# Patient Record
Sex: Male | Born: 1985
Health system: Southern US, Community
[De-identification: ages and names within clinical notes are randomized; demographics above are authoritative.]

---
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
## 2015-11-10 ENCOUNTER — Ambulatory Visit (INDEPENDENT_AMBULATORY_CARE_PROVIDER_SITE_OTHER): Payer: No Typology Code available for payment source | Admitting: Physician Assistant

## 2015-11-10 ENCOUNTER — Encounter: Payer: Self-pay | Admitting: Physician Assistant

## 2015-11-10 VITALS — BP 110/80 | HR 87 | Temp 99.0°F | Resp 16 | Wt 147.4 lb

## 2015-11-10 DIAGNOSIS — R6889 Other general symptoms and signs: Secondary | ICD-10-CM | POA: Diagnosis not present

## 2015-11-10 DIAGNOSIS — J101 Influenza due to other identified influenza virus with other respiratory manifestations: Secondary | ICD-10-CM | POA: Diagnosis not present

## 2015-11-10 LAB — POCT INFLUENZA A/B
Influenza A, POC: POSITIVE — AB
Influenza B, POC: NEGATIVE

## 2015-11-10 MED ORDER — OSELTAMIVIR PHOSPHATE 75 MG PO CAPS: 75.0000 mg | ORAL_CAPSULE | Freq: Two times a day (BID) | ORAL | Status: DC

## 2015-11-10 NOTE — Patient Instructions (Signed)

## 2015-11-10 NOTE — Progress Notes (Signed)
Patient: Steven Sanchez Male    DOB: June 15, 1986   30 y.o.   MRN: [id number] Visit Date: 11/10/2015  Today's Provider: Margaretann Loveless, PA-C   Chief Complaint  Patient presents with  . Flu like symptoms   Subjective:    HPI Steven Sanchez is here with c/o sore throat on Monday. Yesterday with body aches, fever with temperature of 100 yesterday, cough, runny nose,sneezing and chest tightness. No chest pain, leg swelling,palpitations, wheezing and SOB. Treatments: Mucinex and Nyquil.  His mother was here earlier today and tested flu positive.      No Known Allergies Previous Medications   No medications on file    Review of Systems  Constitutional: Positive for fever, chills and fatigue.  HENT: Positive for congestion, ear pain, postnasal drip, rhinorrhea, sinus pressure, sneezing and sore throat (Just scratchy). Negative for hearing loss and trouble swallowing.   Respiratory: Positive for cough and chest tightness. Negative for shortness of breath and wheezing.   Cardiovascular: Negative for chest pain, palpitations and leg swelling.  Gastrointestinal: Negative for nausea, vomiting, abdominal pain and diarrhea.  Musculoskeletal: Positive for myalgias.  Neurological: Positive for headaches. Negative for dizziness.    Social History  Substance Use Topics  . Smoking status: Never Smoker   . Smokeless tobacco: Not on file  . Alcohol Use: Yes     Comment: occasional   Objective:   BP 110/80 mmHg  Pulse 87  Temp(Src) 99 F (37.2 C) (Oral)  Resp 16  Wt 147 lb 6.4 oz (66.86 kg)  SpO2 99%  Physical Exam  Constitutional: He appears well-developed and well-nourished. No distress.  HENT:  Head: Normocephalic and atraumatic.  Right Ear: Hearing, external ear and ear canal normal. Tympanic membrane is not erythematous and not bulging. A middle ear effusion is present.  Left Ear: Hearing, tympanic membrane, external ear and ear canal normal. Tympanic membrane is not  erythematous and not bulging.  No middle ear effusion.  Nose: Mucosal edema and rhinorrhea present. Right sinus exhibits no maxillary sinus tenderness and no frontal sinus tenderness. Left sinus exhibits no maxillary sinus tenderness and no frontal sinus tenderness.  Mouth/Throat: Uvula is midline and mucous membranes are normal. Posterior oropharyngeal erythema present. No oropharyngeal exudate or posterior oropharyngeal edema.  Eyes: Conjunctivae and EOM are normal. Pupils are equal, round, and reactive to light. Right eye exhibits no discharge. Left eye exhibits no discharge.  Neck: Normal range of motion. Neck supple. No tracheal deviation present. No Brudzinski's sign and no Kernig's sign noted. No thyromegaly present.  Cardiovascular: Normal rate, regular rhythm and normal heart sounds.  Exam reveals no gallop and no friction rub.   No murmur heard. Pulmonary/Chest: Effort normal and breath sounds normal. No stridor. No respiratory distress. He has no wheezes. He has no rales.  Lymphadenopathy:    He has no cervical adenopathy.  Skin: Skin is warm and dry. He is not diaphoretic.  Vitals reviewed.       Assessment & Plan:     1. Influenza A Will treat with tamiflu as below since he is still within the 48 hr window of symptom onset. He may use tylenol and IBU alternating for fever and body aches. He needs to stay well hydrated.  He needs to get plenty of rest. He is to call the office if symptoms fail to improve or worsen. - oseltamivir (TAMIFLU) 75 MG capsule; Take 1 capsule (75 mg total) by mouth 2 (two) times  daily.  Dispense: 10 capsule; Refill: 0  2. Flu-like symptoms Positive for Influenza A. - POCT Influenza A/B       Margaretann LovelessJennifer M Burnette, PA-C  [facility]

## 2017-01-03 ENCOUNTER — Encounter: Payer: Self-pay | Admitting: Family Medicine

## 2017-01-03 ENCOUNTER — Ambulatory Visit (INDEPENDENT_AMBULATORY_CARE_PROVIDER_SITE_OTHER): Payer: 59 | Admitting: Family Medicine

## 2017-01-03 VITALS — BP 112/64 | HR 74 | Temp 97.4°F | Resp 16 | Wt 147.0 lb

## 2017-01-03 DIAGNOSIS — J069 Acute upper respiratory infection, unspecified: Secondary | ICD-10-CM | POA: Diagnosis not present

## 2017-01-03 NOTE — Patient Instructions (Signed)
Discussed use of Mucinex D for congestion and Delsym for cough. Call me if not improving over the next few days.

## 2017-01-03 NOTE — Progress Notes (Signed)
Subjective:     Patient ID: Raechel AcheStefan M Lafrance, male   DOB: 1986/06/28, 31 y.o.   MRN: [id number]  HPI  Chief Complaint  Patient presents with  . Sinus Problem    Patient comes into office today with complaints of sinus pain and pressure for the past 7 days. Patient reports cough productive of yellow phlegm, ears popping, muscle aches, sinus headaches and nauseas and vomiting. 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 Reports sinus drainage is clear.   Review of Systems     Objective:   Physical Exam  Constitutional: He appears well-developed and well-nourished. No distress.  Ears: T.M's intact without inflammation Sinuses: mild paranasal sinus tenderness Throat: no tonsillar enlargement or exudate Neck: no cervical adenopathy Lungs: clear     Assessment:    1. Viral upper respiratory tract infection     Plan:    Discussed use of Delsym and Mucinex D.

## 2017-05-07 DIAGNOSIS — Z23 Encounter for immunization: Secondary | ICD-10-CM | POA: Diagnosis not present

## 2018-03-13 DIAGNOSIS — Z23 Encounter for immunization: Secondary | ICD-10-CM | POA: Diagnosis not present

## 2018-07-22 ENCOUNTER — Ambulatory Visit: Payer: 59 | Admitting: Physician Assistant

## 2018-07-22 ENCOUNTER — Encounter: Payer: Self-pay | Admitting: Physician Assistant

## 2018-07-22 VITALS — BP 140/100 | HR 87 | Temp 98.2°F | Resp 16 | Wt 148.0 lb

## 2018-07-22 DIAGNOSIS — Z202 Contact with and (suspected) exposure to infections with a predominantly sexual mode of transmission: Secondary | ICD-10-CM

## 2018-07-22 MED ORDER — AZITHROMYCIN 500 MG PO TABS: 1000.0000 mg | ORAL_TABLET | Freq: Once | ORAL | 0 refills | Status: AC

## 2018-07-22 NOTE — Progress Notes (Signed)
Acute Office Visit  Subjective:    Patient ID: Steven Sanchez, male    DOB: [birth year], 32 y.o.   MRN: [id number]  Chief Complaint  Patient presents with  . SEXUALLY TRANSMITTED DISEASE   HPI Patient is in today for possible sexually transmitted disease, patient reports that in August he had unprotected intercourse and has recently found out that partner was positive for STI. Patient reports that in August he did have some discharge that cleared on its own. Patient denies any symptoms and reports he has not been sexually active since then. Partner tested positive for chlamydia. No other sexual partners.  No past medical history on file.  No past surgical history on file.  Family History  Problem Relation Age of Onset  . Hypertension Father     Social History   Socioeconomic History  . Marital status: Single    Spouse name: Not on file  . Number of children: Not on file  . Years of education: Not on file  . Highest education level: Not on file  Occupational History  . Not on file  Social Needs  . Financial resource strain: Not on file  . Food insecurity:    Worry: Not on file    Inability: Not on file  . Transportation needs:    Medical: Not on file    Non-medical: Not on file  Tobacco Use  . Smoking status: Never Smoker  . Smokeless tobacco: Never Used  Substance and Sexual Activity  . Alcohol use: Yes    Comment: occasional  . Drug use: No  . Sexual activity: Not on file  Lifestyle  . Physical activity:    Days per week: Not on file    Minutes per session: Not on file  . Stress: Not on file  Relationships  . Social connections:    Talks on phone: Not on file    Gets together: Not on file    Attends religious service: Not on file    Active member of club or organization: Not on file    Attends meetings of clubs or organizations: Not on file    Relationship status: Not on file  . Intimate partner violence:    Fear of current or ex partner: Not on file      Emotionally abused: Not on file    Physically abused: Not on file    Forced sexual activity: Not on file  Other Topics Concern  . Not on file  Social History Narrative  . Not on file    No outpatient medications prior to visit.   No facility-administered medications prior to visit.     No Known Allergies  Review of Systems  Constitutional: Negative.   Respiratory: Negative.   Cardiovascular: Negative.   Gastrointestinal: Negative.   Genitourinary: Negative.        Objective:    Physical Exam  Constitutional: He appears well-developed and well-nourished. No distress.  HENT:  Head: Normocephalic and atraumatic.  Neck: Normal range of motion. Neck supple.  Cardiovascular: Normal rate, regular rhythm and normal heart sounds. Exam reveals no gallop and no friction rub.  No murmur heard. Pulmonary/Chest: Effort normal and breath sounds normal. No respiratory distress. He has no wheezes. He has no rales.  Genitourinary:  Genitourinary Comments: Patient declines  Skin: He is not diaphoretic.  Vitals reviewed.   BP (!) 140/100 (BP Location: Left Arm, Patient Position: Sitting, Cuff Size: Normal) Comment: patient finished working out about 30 mins ago  Pulse 87   Temp 98.2 F (36.8 C) (Oral)   Resp 16   Wt 148 lb (67.1 kg)   SpO2 97%  Wt Readings from Last 3 Encounters:  07/22/18 148 lb (67.1 kg)  01/03/17 147 lb (66.7 kg)  11/10/15 147 lb 6.4 oz (66.9 kg)    Health Maintenance Due  Topic Date Due  . HIV Screening  03/16/2003  . TETANUS/TDAP  03/16/2007  . INFLUENZA VACCINE  03/21/2018    There are no preventive care reminders to display for this patient.   No results found for: TSH No results found for: WBC, HGB, HCT, MCV, PLT No results found for: NA, K, CHLORIDE, CO2, GLUCOSE, BUN, CREATININE, BILITOT, ALKPHOS, AST, ALT, PROT, ALBUMIN, CALCIUM, ANIONGAP, EGFR, GFR No results found for: CHOL No results found for: HDL No results found for: LDLCALC No  results found for: TRIG No results found for: CHOLHDL No results found for: HGBA1C     Assessment & Plan:   1. STD exposure Will collect urine for STD screen as below. Patient to bring back urine first thing in the morning. Will treat proactively for chlamydia as below with azithromycin. I will f/u pending results.  - GC/Chlamydia Probe Amp - azithromycin (ZITHROMAX) 500 MG tablet; Take 2 tablets (1,000 mg total) by mouth once for 1 dose.  Dispense: 2 tablet; Refill: 0  The entirety of the information documented in the History of Present Illness, Review of Systems and Physical Exam were personally obtained by me. Portions of this information were initially documented by Lendon Ka, CMA and reviewed by me for thoroughness and accuracy.  Mar Daring, PA-C

## 2018-09-11 ENCOUNTER — Ambulatory Visit: Payer: 59 | Admitting: Physician Assistant

## 2018-09-11 ENCOUNTER — Encounter: Payer: Self-pay | Admitting: Family Medicine

## 2018-09-11 ENCOUNTER — Encounter: Payer: Self-pay | Admitting: Physician Assistant

## 2018-09-11 VITALS — BP 137/74 | HR 91 | Temp 100.3°F | Resp 16 | Wt 147.0 lb

## 2018-09-11 DIAGNOSIS — J40 Bronchitis, not specified as acute or chronic: Secondary | ICD-10-CM

## 2018-09-11 MED ORDER — AZITHROMYCIN 250 MG PO TABS
ORAL_TABLET | ORAL | 0 refills | Status: DC
Start: 2018-09-11 — End: 2019-05-27

## 2018-09-11 MED ORDER — PREDNISONE 10 MG (21) PO TBPK
ORAL_TABLET | ORAL | 0 refills | Status: DC
Start: 2018-09-11 — End: 2019-05-27

## 2018-09-11 NOTE — Progress Notes (Signed)
Patient: Steven Sanchez Male    DOB: [birth year]   32 y.o.   MRN: [id number] Visit Date: 09/11/2018  Today's Provider: Margaretann LovelessJennifer M Culley Hedeen, PA-C   Chief Complaint  Patient presents with  . Cough   Subjective:     Cough  This is a new problem. The current episode started in the past 7 days (2 days). The problem has been gradually worsening. The cough is non-productive. Associated symptoms include chills, ear congestion, a fever, headaches, nasal congestion, rhinorrhea and a sore throat. Pertinent negatives include no chest pain, ear pain, heartburn, hemoptysis, myalgias, postnasal drip, rash, shortness of breath, sweats, weight loss or wheezing. Treatments tried: Mucinex  The treatment provided mild relief.     Patient has had nasal congestion and cough for 2 days. Patient states cough is not productive. Patient also has symptoms of fever, chills, ear congestion, headaches, sore throat, and chest tightness. Patient has been taking otc Mucinex with mild relief.  No Known Allergies  No current outpatient medications on file.  Review of Systems  Constitutional: Positive for chills and fever. Negative for appetite change and weight loss.  HENT: Positive for congestion, rhinorrhea and sore throat. Negative for ear pain and postnasal drip.   Respiratory: Positive for cough and chest tightness. Negative for hemoptysis, shortness of breath and wheezing.   Cardiovascular: Negative for chest pain and palpitations.  Gastrointestinal: Negative for abdominal pain, heartburn, nausea and vomiting.  Musculoskeletal: Negative for myalgias.  Skin: Negative for rash.  Neurological: Positive for headaches.    Social History   Tobacco Use  . Smoking status: Never Smoker  . Smokeless tobacco: Never Used  Substance Use Topics  . Alcohol use: Yes    Comment: occasional      Objective:   BP 137/74 (BP Location: Right Arm, Patient Position: Sitting, Cuff Size: Large)   Pulse 91   Temp  100.3 F (37.9 C) (Oral)   Resp 16   Wt 147 lb (66.7 kg)   SpO2 96%  Vitals:   09/11/18 1751  BP: 137/74  Pulse: 91  Resp: 16  Temp: 100.3 F (37.9 C)  TempSrc: Oral  SpO2: 96%  Weight: 147 lb (66.7 kg)     Physical Exam Vitals signs reviewed.  Constitutional:      General: He is not in acute distress.    Appearance: He is well-developed. He is not diaphoretic.  HENT:     Head: Normocephalic and atraumatic.     Right Ear: Hearing, tympanic membrane, ear canal and external ear normal. No middle ear effusion. Tympanic membrane is not erythematous or bulging.     Left Ear: Hearing, tympanic membrane, ear canal and external ear normal.  No middle ear effusion. Tympanic membrane is not erythematous or bulging.     Nose: Mucosal edema and rhinorrhea present.     Right Sinus: No maxillary sinus tenderness or frontal sinus tenderness.     Left Sinus: No maxillary sinus tenderness or frontal sinus tenderness.     Mouth/Throat:     Pharynx: Uvula midline. No oropharyngeal exudate or posterior oropharyngeal erythema.  Eyes:     General:        Right eye: No discharge.        Left eye: No discharge.     Conjunctiva/sclera: Conjunctivae normal.     Pupils: Pupils are equal, round, and reactive to light.  Neck:     Musculoskeletal: Normal range of motion and neck supple.  Thyroid: No thyromegaly.     Trachea: No tracheal deviation.     Meningeal: Brudzinski's sign and Kernig's sign absent.  Cardiovascular:     Rate and Rhythm: Normal rate and regular rhythm.     Heart sounds: Normal heart sounds. No murmur. No friction rub. No gallop.   Pulmonary:     Effort: Pulmonary effort is normal. No respiratory distress.     Breath sounds: No stridor. Wheezing (faint wheezes heard in upper lung fields bilaterally) present. No rales.  Lymphadenopathy:     Cervical: No cervical adenopathy.  Skin:    General: Skin is warm and dry.         Assessment & Plan    1.  Bronchitis Worsening. Will treat with zpak and prednisone. Push fluids. Rest. Call if worsening.  - azithromycin (ZITHROMAX) 250 MG tablet; Take 2 tablets PO on day one, and one tablet PO daily thereafter until completed.  Dispense: 6 tablet; Refill: 0 - predniSONE (STERAPRED UNI-PAK 21 TAB) 10 MG (21) TBPK tablet; 6 day taper; take as directed on package instructions  Dispense: 21 tablet; Refill: 0     Margaretann LovelessJennifer M Elsie Sakuma, PA-C  [facility]

## 2018-11-29 ENCOUNTER — Encounter: Payer: Self-pay | Admitting: Family Medicine

## 2018-11-29 ENCOUNTER — Other Ambulatory Visit: Payer: Self-pay

## 2018-11-29 ENCOUNTER — Ambulatory Visit (INDEPENDENT_AMBULATORY_CARE_PROVIDER_SITE_OTHER): Payer: 59 | Admitting: Family Medicine

## 2018-11-29 VITALS — BP 120/72 | Temp 98.2°F | Resp 16 | Wt 150.0 lb

## 2018-11-29 DIAGNOSIS — R300 Dysuria: Secondary | ICD-10-CM

## 2018-11-29 DIAGNOSIS — N489 Disorder of penis, unspecified: Secondary | ICD-10-CM

## 2018-11-29 DIAGNOSIS — N410 Acute prostatitis: Secondary | ICD-10-CM

## 2018-11-29 DIAGNOSIS — N419 Inflammatory disease of prostate, unspecified: Secondary | ICD-10-CM

## 2018-11-29 MED ORDER — DOXYCYCLINE HYCLATE 100 MG PO CAPS: 100.0000 mg | ORAL_CAPSULE | Freq: Two times a day (BID) | ORAL | 0 refills | Status: AC

## 2018-11-29 NOTE — Progress Notes (Signed)
       Patient: Steven Sanchez Male    DOB: [birth year]   32 y.o.   MRN: [id number] Visit Date: 11/29/2018  Today's Provider: Megan Mans, MD   Chief Complaint  Patient presents with  . Exposure to STD   Subjective:     HPI  Patient states that he is having symptoms of the STD/Chamydia that he was treated for a few months ago. He states that he has a small bump on his penis that is painful and some burning with urination. Symptoms started 1 day ago. No new sex partners.  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 This is different from previous visit as then he had a discharge from his penis. He has mild dysuria and some split stream with this episode. Mild dark urine in am. No Known Allergies   Current Outpatient Medications:  .  azithromycin (ZITHROMAX) 250 MG tablet, Take 2 tablets PO on day one, and one tablet PO daily thereafter until completed. (Patient not taking: Reported on 11/29/2018), Disp: 6 tablet, Rfl: 0 .  predniSONE (STERAPRED UNI-PAK 21 TAB) 10 MG (21) TBPK tablet, 6 day taper; take as directed on package instructions (Patient not taking: Reported on 11/29/2018), Disp: 21 tablet, Rfl: 0  Review of Systems  Genitourinary: Positive for dysuria and genital sores.  All other systems reviewed and are negative.   Social History   Tobacco Use  . Smoking status: Never Smoker  . Smokeless tobacco: Never Used  Substance Use Topics  . Alcohol use: Yes    Comment: occasional      Objective:   BP 120/72 (BP Location: Left Arm, Patient Position: Sitting, Cuff Size: Normal)   Temp 98.2 F (36.8 C) (Oral)   Resp 16   Wt 150 lb (68 kg)   SpO2 98%  Vitals:   11/29/18 0952  BP: 120/72  Resp: 16  Temp: 98.2 F (36.8 C)  TempSrc: Oral  SpO2: 98%  Weight: 150 lb (68 kg)     Physical Exam Constitutional:      Appearance: Normal appearance.  HENT:     Head: Normocephalic and atraumatic.     Right Ear: External ear normal.     Left Ear:  External ear normal.  Abdominal:     Palpations: Abdomen is soft.  Genitourinary:    Penis: Normal.      Comments: No discharge. Mild sore on midshaft of penis--lookd more like pimple than herpetic. Neurological:     General: No focal deficit present.     Mental Status: He is alert and oriented to person, place, and time.  Psychiatric:        Mood and Affect: Mood normal.        Behavior: Behavior normal.        Thought Content: Thought content normal.        Judgment: Judgment normal.         Assessment & Plan    1. Burning with urination Recheck. Wife was also treated at time of last exposure. - Chlamydia/GC NAA, Confirmation  2. Dysuria  - Herpes simplex virus culture  3. Penile lesion Tested for Herpes. 4.Prostatitis Likely source of symptoms vs STD today. Treat with Doxy for 1 week. F/u as needed.    Richard Wendelyn Breslow, MD  [facility]

## 2018-12-01 LAB — CHLAMYDIA/GC NAA, CONFIRMATION
Chlamydia trachomatis, NAA: NEGATIVE
Neisseria gonorrhoeae, NAA: NEGATIVE

## 2018-12-02 ENCOUNTER — Other Ambulatory Visit: Payer: Self-pay

## 2018-12-02 LAB — HERPES SIMPLEX VIRUS CULTURE

## 2018-12-02 LAB — SPECIMEN STATUS REPORT

## 2018-12-02 MED ORDER — VALACYCLOVIR HCL 500 MG PO TABS: 500.0000 mg | ORAL_TABLET | Freq: Two times a day (BID) | ORAL | 11 refills | Status: DC

## 2019-05-27 ENCOUNTER — Encounter: Payer: Self-pay | Admitting: Family Medicine

## 2019-05-27 ENCOUNTER — Other Ambulatory Visit: Payer: Self-pay

## 2019-05-27 ENCOUNTER — Ambulatory Visit (INDEPENDENT_AMBULATORY_CARE_PROVIDER_SITE_OTHER): Payer: 59 | Admitting: Family Medicine

## 2019-05-27 VITALS — Temp 97.2°F

## 2019-05-27 DIAGNOSIS — J029 Acute pharyngitis, unspecified: Secondary | ICD-10-CM

## 2019-05-27 NOTE — Progress Notes (Signed)
BERTEL VENARD  MRN: [id number] DOB: [birth year] Virtual Visit via Telephone Note  I connected with Salem Caster on 05/27/19 at  2:00 PM EDT by telephone and verified that I am speaking with the correct person using two identifiers.  Location: Patient: Home Provider: Office   I discussed the limitations, risks, security and privacy concerns of performing an evaluation and management service by telephone and the availability of in person appointments. I also discussed with the patient that there may be a patient responsible charge related to this service. The patient expressed understanding and agreed to proceed.   Subjective:  HPI   The patient is a 33 year old male that is being evaluated via phone conversation for sore throat.    There are no active problems to display for this patient.   No past medical history on file.  Social History   Socioeconomic History  . Marital status: Single    Spouse name: Not on file  . Number of children: Not on file  . Years of education: Not on file  . Highest education level: Not on file  Occupational History  . Not on file  Social Needs  . Financial resource strain: Not on file  . Food insecurity    Worry: Not on file    Inability: Not on file  . Transportation needs    Medical: Not on file    Non-medical: Not on file  Tobacco Use  . Smoking status: Never Smoker  . Smokeless tobacco: Never Used  Substance and Sexual Activity  . Alcohol use: Yes    Comment: occasional  . Drug use: No  . Sexual activity: Not on file  Lifestyle  . Physical activity    Days per week: Not on file    Minutes per session: Not on file  . Stress: Not on file  Relationships  . Social Herbalist on phone: Not on file    Gets together: Not on file    Attends religious service: Not on file    Active member of club or organization: Not on file    Attends meetings of clubs or organizations: Not on file    Relationship status: Not on  file  . Intimate partner violence    Fear of current or ex partner: Not on file    Emotionally abused: Not on file    Physically abused: Not on file    Forced sexual activity: Not on file  Other Topics Concern  . Not on file  Social History Narrative  . Not on file    Outpatient Encounter Medications as of 05/27/2019  Medication Sig  . azithromycin (ZITHROMAX) 250 MG tablet Take 2 tablets PO on day one, and one tablet PO daily thereafter until completed. (Patient not taking: Reported on 11/29/2018)  . predniSONE (STERAPRED UNI-PAK 21 TAB) 10 MG (21) TBPK tablet 6 day taper; take as directed on package instructions (Patient not taking: Reported on 11/29/2018)  . valACYclovir (VALTREX) 500 MG tablet Take 1 tablet (500 mg total) by mouth 2 (two) times daily.   No facility-administered encounter medications on file as of 05/27/2019.     No Known Allergies  Review of Systems  Constitutional: Negative for chills, diaphoresis, fever and malaise/fatigue.  HENT: Positive for congestion and sore throat. Negative for ear pain, sinus pain and tinnitus.   Respiratory: Positive for cough (dry). Negative for sputum production, shortness of breath and wheezing.     Objective:  Earlean Polka Marland Kitchen)  97.2 F (36.2 C)   Physical Exam: No apparent respiratory distress during telephonic interview.  Assessment and Plan :  1. Sore throat Woke up with some sore throat, burning in nose and PND today. No fever, loss of taste/smell, GI upset, earache or significant cough. Tried an OTC cold medication with Ibuprofen and saltwater gargles. Symptoms seem to be improved but concern by employer and co-workers prompted this telephonic visit. Reviewed COVID-19 screening questions/symptoms and advised to monitor at home for changes. May continue present regimen and isolate at home for the next 2-3 days. May be early symptoms or just some PND irritation due to allergies. Return call if needed.  Follow Up Instructions:  I  discussed the assessment and treatment plan with the patient. The patient was provided an opportunity to ask questions and all were answered. The patient agreed with the plan and demonstrated an understanding of the instructions.   The patient was advised to call back or seek an in-person evaluation if the symptoms worsen or if the condition fails to improve as anticipated.  I provided 10 minutes of non-face-to-face time during this encounter.   Dortha Kern, PA

## 2019-11-09 DIAGNOSIS — J0100 Acute maxillary sinusitis, unspecified: Secondary | ICD-10-CM | POA: Diagnosis not present

## 2019-11-16 ENCOUNTER — Other Ambulatory Visit: Payer: Self-pay | Admitting: Family Medicine

## 2019-11-16 NOTE — Telephone Encounter (Signed)
Requested Prescriptions  Pending Prescriptions Disp Refills  . valACYclovir (VALTREX) 500 MG tablet [Pharmacy Med Name: VALACYCLOVIR HCL 500 MG TABLET] 14 tablet 11    Sig: TAKE 1 TABLET BY MOUTH TWICE A DAY     Antimicrobials:  Antiviral Agents - Anti-Herpetic Passed - 11/16/2019  4:50 PM      Passed - Valid encounter within last 12 months    Recent Outpatient Visits          5 months ago Sore throat   [facility] [place], [place], [place]   11 months ago Burning with urination   [facility] [place]., [place]   1 year ago Bronchitis   [facility] [place], Alessandra [place], [place]   1 year ago STD exposure   [facility] [place], [place], [place]   2 years ago Viral upper respiratory tract infection   [facility] [place], [place], [place]

## 2019-12-13 ENCOUNTER — Emergency Department
Admission: EM | Admit: 2019-12-13 | Discharge: 2019-12-14 | Disposition: A | Discharge status: Other Acute Inpatient Hospital | Payer: BC Managed Care – PPO | Attending: Emergency Medicine | Admitting: Emergency Medicine

## 2019-12-13 ENCOUNTER — Inpatient Hospital Stay
Admission: AD | Admit: 2019-12-13 | Payer: Self-pay | Source: Other Acute Inpatient Hospital | Admitting: Internal Medicine

## 2019-12-13 ENCOUNTER — Emergency Department (INDEPENDENT_AMBULATORY_CARE_PROVIDER_SITE_OTHER)
Admit: 2019-12-13 | Discharge: 2019-12-13 | Disposition: A | Discharge status: Home / Self Care | Payer: BC Managed Care – PPO | Attending: Cardiovascular Disease | Admitting: Cardiovascular Disease

## 2019-12-13 ENCOUNTER — Emergency Department: Payer: BC Managed Care – PPO

## 2019-12-13 ENCOUNTER — Other Ambulatory Visit: Payer: Self-pay

## 2019-12-13 DIAGNOSIS — Z20822 Contact with and (suspected) exposure to covid-19: Secondary | ICD-10-CM | POA: Insufficient documentation

## 2019-12-13 DIAGNOSIS — R072 Precordial pain: Secondary | ICD-10-CM | POA: Diagnosis not present

## 2019-12-13 DIAGNOSIS — R799 Abnormal finding of blood chemistry, unspecified: Secondary | ICD-10-CM | POA: Diagnosis not present

## 2019-12-13 DIAGNOSIS — R748 Abnormal levels of other serum enzymes: Secondary | ICD-10-CM | POA: Diagnosis not present

## 2019-12-13 DIAGNOSIS — F17200 Nicotine dependence, unspecified, uncomplicated: Secondary | ICD-10-CM | POA: Diagnosis not present

## 2019-12-13 DIAGNOSIS — R05 Cough: Secondary | ICD-10-CM | POA: Insufficient documentation

## 2019-12-13 DIAGNOSIS — I361 Nonrheumatic tricuspid (valve) insufficiency: Secondary | ICD-10-CM | POA: Diagnosis not present

## 2019-12-13 DIAGNOSIS — R7989 Other specified abnormal findings of blood chemistry: Secondary | ICD-10-CM | POA: Diagnosis not present

## 2019-12-13 DIAGNOSIS — R509 Fever, unspecified: Secondary | ICD-10-CM | POA: Insufficient documentation

## 2019-12-13 DIAGNOSIS — R202 Paresthesia of skin: Secondary | ICD-10-CM | POA: Diagnosis not present

## 2019-12-13 DIAGNOSIS — R200 Anesthesia of skin: Secondary | ICD-10-CM | POA: Insufficient documentation

## 2019-12-13 DIAGNOSIS — R079 Chest pain, unspecified: Secondary | ICD-10-CM | POA: Diagnosis not present

## 2019-12-13 DIAGNOSIS — J069 Acute upper respiratory infection, unspecified: Secondary | ICD-10-CM | POA: Diagnosis not present

## 2019-12-13 DIAGNOSIS — R778 Other specified abnormalities of plasma proteins: Secondary | ICD-10-CM

## 2019-12-13 DIAGNOSIS — R0789 Other chest pain: Secondary | ICD-10-CM | POA: Diagnosis not present

## 2019-12-13 LAB — CBC
HCT: 44.2 % (ref 39.0–52.0)
Hemoglobin: 15.5 g/dL (ref 13.0–17.0)
MCH: 30.4 pg (ref 26.0–34.0)
MCHC: 35.1 g/dL (ref 30.0–36.0)
MCV: 86.7 fL (ref 80.0–100.0)
Platelets: 148 10*3/uL — ABNORMAL LOW (ref 150–400)
RBC: 5.1 MIL/uL (ref 4.22–5.81)
RDW: 11.9 % (ref 11.5–15.5)
WBC: 7.8 10*3/uL (ref 4.0–10.5)
nRBC: 0 % (ref 0.0–0.2)

## 2019-12-13 LAB — URINALYSIS, COMPLETE (UACMP) WITH MICROSCOPIC
Bilirubin Urine: NEGATIVE
Glucose, UA: NEGATIVE mg/dL
Hgb urine dipstick: NEGATIVE
Ketones, ur: 15 mg/dL — AB
Leukocytes,Ua: NEGATIVE
Nitrite: NEGATIVE
Protein, ur: 30 mg/dL — AB
Specific Gravity, Urine: 1.02 (ref 1.005–1.030)
Squamous Epithelial / HPF: NONE SEEN (ref 0–5)
pH: 6.5 (ref 5.0–8.0)

## 2019-12-13 LAB — BASIC METABOLIC PANEL
Anion gap: 6 (ref 5–15)
BUN: 18 mg/dL (ref 6–20)
CO2: 26 mmol/L (ref 22–32)
Calcium: 9 mg/dL (ref 8.9–10.3)
Chloride: 104 mmol/L (ref 98–111)
Creatinine, Ser: 0.99 mg/dL (ref 0.61–1.24)
GFR calc Af Amer: >60 mL/min (ref >60)
GFR calc non Af Amer: >60 mL/min (ref >60)
Glucose, Bld: 113 mg/dL — ABNORMAL HIGH (ref 70–99)
Potassium: 3.5 mmol/L (ref 3.5–5.1)
Sodium: 136 mmol/L (ref 135–145)

## 2019-12-13 LAB — RESPIRATORY PANEL BY RT PCR (FLU A&B, COVID)
Influenza A by PCR: NEGATIVE
Influenza B by PCR: NEGATIVE
SARS Coronavirus 2 by RT PCR: NEGATIVE

## 2019-12-13 LAB — ECHOCARDIOGRAM COMPLETE
Height: 67 in
Weight: 2400 oz

## 2019-12-13 LAB — TROPONIN I (HIGH SENSITIVITY)
Troponin I (High Sensitivity): 5108 ng/L (ref <18)
Troponin I (High Sensitivity): 7153 ng/L (ref <18)
Troponin I (High Sensitivity): 7861 ng/L (ref <18)

## 2019-12-13 LAB — LACTIC ACID, PLASMA: Lactic Acid, Venous: 1.1 mmol/L (ref 0.5–1.9)

## 2019-12-13 MED ORDER — SODIUM CHLORIDE 0.9% FLUSH: 3.0000 mL | Freq: Once | INTRAVENOUS | Status: DC

## 2019-12-13 MED ORDER — COLCHICINE 0.3 MG HALF TABLET
0.3000 mg | ORAL_TABLET | Freq: Once | ORAL | Status: DC
  Filled 2019-12-13: qty 1

## 2019-12-13 MED ORDER — ACETAMINOPHEN 500 MG PO TABS
1000.0000 mg | ORAL_TABLET | Freq: Once | ORAL | Status: AC
  Administered 2019-12-13: 1000 mg via ORAL
  Filled 2019-12-13: qty 2

## 2019-12-13 MED ORDER — COLCHICINE 0.6 MG PO TABS
0.3000 mg | ORAL_TABLET | Freq: Once | ORAL | Status: AC
  Administered 2019-12-13: 0.3 mg via ORAL
  Filled 2019-12-13: qty 0.5

## 2019-12-13 MED ORDER — SODIUM CHLORIDE 0.9 % IV BOLUS
1000.0000 mL | Freq: Once | INTRAVENOUS | Status: AC
  Administered 2019-12-13: 1000 mL via INTRAVENOUS

## 2019-12-13 NOTE — ED Triage Notes (Addendum)
Pt comes via [facility] from home with c/o fever that started yesterday. Pt states he was able to control it with medication. Pt states it then spiked again this am.  Current temp is 99.5

## 2019-12-13 NOTE — ED Provider Notes (Signed)
Pain is 3 or 4 out of 10, repeat EKG reveals some ST elevations, not significantly changed from prior.  I will touch base with cardiology.   Emily Filbert, MD 12/13/19 1655

## 2019-12-13 NOTE — ED Notes (Signed)
First Nurse Note: Pt to ED via [facility]. Per EMS pt has had fever this morning. Went to urgent care where he had a negative COVID test. Pt called EMS to bring him to the ED because he wanted to have blood work done. Pt was ambulatory on scene per EMS. Pt was able to drive from his house to his mother in laws house, which is where EMS picked him up. Pt is in NAD.

## 2019-12-13 NOTE — ED Notes (Signed)
Called RN Aundra Millet and updated her on pt and that he is on his way to room at this time.

## 2019-12-13 NOTE — ED Notes (Signed)
This RN to bedside, pt resting in bed with lights dimmed for comfort. Apologized and explained delay to patient, introduced self to patient's wife. Pt c/o now increasing CP from 1-2 to now 3-4. Repeat EKG obtained by this RN.

## 2019-12-13 NOTE — ED Notes (Signed)
Date and time results received: 12/13/19 1:40 PM  (use smartphrase ".now" to insert current time)  Test: Trop Critical Value: 5108  Name of Provider Notified: Dr. Marisa Severin  Orders Received? Or Actions Taken?: Repeat EKG

## 2019-12-13 NOTE — ED Notes (Signed)
Pt now also states that he was at the store and had some chest pain and numbness down arm. Pt states he did have a little chest pain last night. Pt states it was achy.

## 2019-12-13 NOTE — Progress Notes (Signed)
*  PRELIMINARY RESULTS* Echocardiogram 2D Echocardiogram has been performed.  Steven Sanchez 12/13/2019, 3:17 PM

## 2019-12-13 NOTE — ED Provider Notes (Signed)
Patient is now scheduled to go to [facility].   Emily Filbert, MD 12/13/19 2157

## 2019-12-13 NOTE — ED Notes (Signed)
EDP made aware pt's temp 103.2. Awaiting orders.

## 2019-12-13 NOTE — ED Notes (Signed)
This RN to bedside, UA collected by this RN, repeat trop collected by this RN at this time. Echo at bedside at this time.

## 2019-12-13 NOTE — ED Notes (Signed)
Pt transported to Xray. 

## 2019-12-13 NOTE — ED Notes (Signed)
Pt taken directly to ED 3.

## 2019-12-13 NOTE — ED Notes (Signed)
Message sent to pharmacy regarding send colchicine to main ED.

## 2019-12-13 NOTE — Progress Notes (Signed)
Discussed case with Dr Darrold Junker Agree that patient would be best served at [facility] for presumed diagnosis of myocarditis Fever 103 troponin 5108 ECG no acute ST elevation or changes pericarditis with S1Q3T3 CXR NAD Dr Darrold Junker indicated COVID negative although I don't see this recorded Differential includes SCAD, myocarditis, lower on list PE   Will likely need indocine/colchicine and both cardiac CTA/cardiac MRI at [facility] Have ordered stat echo to be done in ER at [facility] before transport hopefully  I have spoken with both bed control and Dr Rudene Anda  Regarding transfer and [facility] will Arrange and [facility] in [place] expecting patient   Charlton Haws MD [facility]

## 2019-12-13 NOTE — ED Notes (Signed)
EDP aware of patient's changing CP at this time and given repeat EKG. VORB for repeat trop x 1 at this time.

## 2019-12-13 NOTE — ED Provider Notes (Signed)
[facility] Emergency Department Provider Note ____________________________________________   First MD Initiated Contact with Patient 12/13/19 1256     (approximate)  I have reviewed the triage vital signs and the nursing notes.   HISTORY  Chief Complaint Fever and Chest Pain    HPI Steven Sanchez is a 34 y.o. male with no significant past medical history who presents with chest pain, acute onset earlier today, described as sharp and substernal in location.  He had associated numbness and tingling in both arms.  He states that the pain has now almost completely subsided on its own.  He noted a fever and body aches since yesterday.  He went to urgent care this morning and had a Covid test which was negative.  He denies any vomiting or diarrhea.  He has had some nonproductive cough but no significant shortness of breath.  He denies any prior history of this pain.  He has no sick contacts or known Covid exposures.  History reviewed. No pertinent past medical history.  There are no problems to display for this patient.   History reviewed. No pertinent surgical history.  Prior to Admission medications   Medication Sig Start Date End Date Taking? Authorizing Provider  acetaminophen (TYLENOL) 325 MG tablet Take 650 mg by mouth every 6 (six) hours as needed.   Yes [provider]  amoxicillin-clavulanate (AUGMENTIN) 875-125 MG tablet Take 1 tablet by mouth 2 (two) times daily. 12/13/19  Yes [provider]    Allergies Patient has no known allergies.  Family History  Problem Relation Age of Onset  . Hypertension Father     Social History Social History   Tobacco Use  . Smoking status: Current Some Day Smoker  . Smokeless tobacco: Never Used  Substance Use Topics  . Alcohol use: Yes    Comment: occasional  . Drug use: No    Review of Systems  Constitutional: Positive for fever. Eyes: No redness. ENT: No sore  throat. Cardiovascular: Positive for chest pain. Respiratory: Denies shortness of breath. Gastrointestinal: No vomiting or diarrhea.  Genitourinary: Negative for flank pain.  Musculoskeletal: Negative for back pain. Skin: Negative for rash. Neurological: Negative for headache.   ____________________________________________   PHYSICAL EXAM:  VITAL SIGNS: ED Triage Vitals  Enc Vitals Group     BP 12/13/19 1243 99/70     Pulse Rate 12/13/19 1243 92     Resp 12/13/19 1243 18     Temp 12/13/19 1243 99.5 F (37.5 C)     Temp src --      SpO2 12/13/19 1243 100 %     Weight 12/13/19 1244 150 lb (68 kg)     Height 12/13/19 1244 5\' 7"  (1.702 m)     Head Circumference --      Peak Flow --      Pain Score 12/13/19 1243 6     Pain Loc --      Pain Edu? --      Excl. in GC? --     Constitutional: Alert and oriented.  Relatively well appearing and in no acute distress. Eyes: Conjunctivae are normal.  Head: Atraumatic. Nose: No congestion/rhinnorhea. Mouth/Throat: Mucous membranes are slightly dry. Neck: Normal range of motion.  Cardiovascular: Normal rate, regular rhythm. Grossly normal heart sounds.  Good peripheral circulation. Respiratory: Normal respiratory effort.  No retractions. Lungs CTAB. Gastrointestinal: Soft and nontender. No distention.  Genitourinary: No flank tenderness. Musculoskeletal: No lower extremity edema.  Extremities warm and well  perfused.  Neurologic:  Normal speech and language. No gross focal neurologic deficits are appreciated.  Skin:  Skin is warm and dry. No rash noted. Psychiatric: Mood and affect are normal. Speech and behavior are normal.  ____________________________________________   LABS (all labs ordered are listed, but only abnormal results are displayed)  Labs Reviewed  BASIC METABOLIC PANEL - Abnormal; Notable for the following components:      Result Value   Glucose, Bld 113 (*)    All other components within normal limits  CBC -  Abnormal; Notable for the following components:   Platelets 148 (*)    All other components within normal limits  URINALYSIS, COMPLETE (UACMP) WITH MICROSCOPIC - Abnormal; Notable for the following components:   Ketones, ur 15 (*)    Protein, ur 30 (*)    Bacteria, UA RARE (*)    All other components within normal limits  TROPONIN I (HIGH SENSITIVITY) - Abnormal; Notable for the following components:   Troponin I (High Sensitivity) 5,108 (*)    All other components within normal limits  TROPONIN I (HIGH SENSITIVITY) - Abnormal; Notable for the following components:   Troponin I (High Sensitivity) 7,153 (*)    All other components within normal limits  RESPIRATORY PANEL BY RT PCR (FLU A&B, COVID)  LACTIC ACID, PLASMA   ____________________________________________  EKG  ED ECG REPORT I, Arta Silence, the attending physician, personally viewed and interpreted this ECG.  Date: 12/13/2019 EKG Time: 1246 Rate: 91 Rhythm: normal sinus rhythm QRS Axis: normal Intervals: normal ST/T Wave abnormalities: <63mm ST elevation in aVL Narrative Interpretation: Nonspecific ST abnormalities   ED ECG REPORT I, Arta Silence, the attending physician, personally viewed and interpreted this ECG.  Date: 12/13/2019 EKG Time: 1301 Rate: 88 Rhythm: normal sinus rhythm QRS Axis: normal Intervals: normal ST/T Wave abnormalities: <37mm ST elevation in aVL Narrative Interpretation: Nonspecific ST abnormalities  ____________________________________________  RADIOLOGY  CXR: No focal infiltrate or other acute abnormality   ____________________________________________   PROCEDURES  Procedure(s) performed: No  Procedures  Critical Care performed: Yes  CRITICAL CARE Performed by: Arta Silence   Total critical care time: 45 minutes  Critical care time was exclusive of separately billable procedures and treating other patients.  Critical care was necessary to treat or  prevent imminent or life-threatening deterioration.  Critical care was time spent personally by me on the following activities: development of treatment plan with patient and/or surrogate as well as nursing, discussions with consultants, evaluation of patient's response to treatment, examination of patient, obtaining history from patient or surrogate, ordering and performing treatments and interventions, ordering and review of laboratory studies, ordering and review of radiographic studies, pulse oximetry and re-evaluation of patient's condition. ____________________________________________   INITIAL IMPRESSION / ASSESSMENT AND PLAN / ED COURSE  Pertinent labs & imaging results that were available during my care of the patient were reviewed by me and considered in my medical decision making (see chart for details).  34 year old male with no significant past medical history presents with fever and body aches since yesterday, as well as an episode of substernal chest pain today which has now mostly subsided.  He went to urgent care this morning and had a negative covid-19 test.  On exam, the patient is overall well-appearing.  His vital signs are normal except for borderline low blood pressure and borderline temperature.  The physical exam is otherwise unremarkable.  EKG shows possible minimal ST elevation in aVL and is read by the machine as possible  acute MI.  However, there are no ST elevations greater than 1 mm in any contiguous leads and the patient does not meet STEMI criteria.  I am also reassured by the fact that his pain has mostly resolved on its own.  I contacted Dr. Darrold Junker, who is covering STEMI; he reviewed the EKG and the clinical history and agrees that he would not recommend emergent cardiac cath.  Differential includes Covid or other viral syndrome, pneumonia, bronchitis, possible myocarditis/pericarditis, or less likely ACS.  Given that the patient has no ACS risk factors or  cardiac history, stable vital signs, and nonspecific EKG findings, my suspicion for primary cardiac etiology is low.  We will obtain lab work-up including cardiac enzymes and sepsis labs, give fluids, and reassess.  ----------------------------------------- 2:33 PM on 12/13/2019 -----------------------------------------  The troponin is significantly elevated.  The patient continues to have no active chest pain and his vital signs are stable. Repeat EKG shows no dynamic changes.  I re-consulted Dr. Darrold Junker and who evaluated the patient in the ED. he advises that the presentation is most consistent with myocarditis rather than CAD.  He contacted Dr. Eden Emms who is covering general cardiology, who then arranged for transfer to [facility] since the patient will likely need cardiac MRI and other tests that we cannot provide at [facility].  Dr. Darrold Junker and I discussed the transfer with the patient, who agrees with the plan.  ----------------------------------------- 3:15 PM on 12/13/2019 -----------------------------------------  The patient has been accepted at [facility].  He is pending transfer.  He remains clinically stable.  ___________________________  Raechel Ache was evaluated in Emergency Department on 12/13/2019 for the symptoms described in the history of present illness. He was evaluated in the context of the global COVID-19 pandemic, which necessitated consideration that the patient might be at risk for infection with the SARS-CoV-2 virus that causes COVID-19. Institutional protocols and algorithms that pertain to the evaluation of patients at risk for COVID-19 are in a state of rapid change based on information released by regulatory bodies including the CDC and federal and state organizations. These policies and algorithms were followed during the patient's care in the ED.  ____________________________________________   FINAL CLINICAL IMPRESSION(S) / ED DIAGNOSES  Final diagnoses:    Elevated troponin      NEW MEDICATIONS STARTED DURING THIS VISIT:  New Prescriptions   No medications on file     Note:  This document was prepared using Dragon voice recognition software and may include unintentional dictation errors.    Dionne Bucy, MD 12/13/19 [phone]

## 2019-12-13 NOTE — Consult Note (Addendum)
[facility] Cardiology  CARDIOLOGY CONSULT NOTE  Patient ID: Steven Sanchez MRN: [id number] DOB/AGE: 12/16/1985 34 y.o.  Admit date: 12/13/2019 Referring Physician [facility] Primary Physician  Primary Cardiologist  Reason for Consultation chest pain  HPI: 34 year old gentleman referred for evaluation of chest pain and abnormal ECG.  The patient is otherwise healthy, no prior medical history, works as a Biochemist, clinical at [employer].  He reports that he was in his usual state of health until yesterday, when he developed low-grade fever and chills, malaise and myalgias.  Last evening, the patient developed high-grade fever with a temperature of 103 and considered coming to the emergency room.  This morning, the patient continued to feel unwell, went to urgent care to be tested for Covid-19 which was reportedly negative.  At approximately 11 AM, the patient developed chest pain, pleuritic in nature, worse when breathing and was sent to [facility] emergency room. After arrival to [facility] ED, ECG revealed sinus rhythm, with incomplete right bundle branch block, with nondiagnostic concave ST elevation in lead aVL only, without ST elevation in contiguous leads or reciprocal STT wave abnormalities.  While in the emergency room, patient became chest pain-free.  Initial lab work remarkable for elevated high-sensitivity troponin of 5108.  Review of systems complete and found to be negative unless listed above     History reviewed. No pertinent past medical history.  History reviewed. No pertinent surgical history.  (Not in a hospital admission)  Social History   Socioeconomic History  . Marital status: Single    Spouse name: Not on file  . Number of children: Not on file  . Years of education: Not on file  . Highest education level: Not on file  Occupational History  . Not on file  Tobacco Use  . Smoking status: Current Some Day Smoker  . Smokeless tobacco: Never Used  Substance and Sexual Activity   . Alcohol use: Yes    Comment: occasional  . Drug use: No  . Sexual activity: Not on file  Other Topics Concern  . Not on file  Social History Narrative  . Not on file   Social Determinants of Health   Financial Resource Strain:   . Difficulty of Paying Living Expenses:   Food Insecurity:   . Worried About Charity fundraiser in the Last Year:   . Arboriculturist in the Last Year:   Transportation Needs:   . Film/video editor (Medical):   Marland Kitchen Lack of Transportation (Non-Medical):   Physical Activity:   . Days of Exercise per Week:   . Minutes of Exercise per Session:   Stress:   . Feeling of Stress :   Social Connections:   . Frequency of Communication with Friends and Family:   . Frequency of Social Gatherings with Friends and Family:   . Attends Religious Services:   . Active Member of Clubs or Organizations:   . Attends Archivist Meetings:   Marland Kitchen Marital Status:   Intimate Partner Violence:   . Fear of Current or Ex-Partner:   . Emotionally Abused:   Marland Kitchen Physically Abused:   . Sexually Abused:     Family History  Problem Relation Age of Onset  . Hypertension Father       Review of systems complete and found to be negative unless listed above      PHYSICAL EXAM  General: Well developed, well nourished, in no acute distress HEENT:  Normocephalic and atramatic Neck:  No  JVD.  Lungs: Clear bilaterally to auscultation and percussion. Heart: HRRR . Normal S1 and S2 without gallops or murmurs.  Abdomen: Bowel sounds are positive, abdomen soft and non-tender  Msk:  Back normal, normal gait. Normal strength and tone for age. Extremities: No clubbing, cyanosis or edema.   Neuro: Alert and oriented X 3. Psych:  Good affect, responds appropriately  Labs:   Lab Results  Component Value Date   WBC 7.8 12/13/2019   HGB 15.5 12/13/2019   HCT 44.2 12/13/2019   MCV 86.7 12/13/2019   PLT 148 (L) 12/13/2019    Recent Labs  Lab 12/13/19 1249  NA 136   K 3.5  CL 104  CO2 26  BUN 18  CREATININE 0.99  CALCIUM 9.0  GLUCOSE 113*   No results found for: CKTOTAL, CKMB, CKMBINDEX, TROPONINI No results found for: CHOL No results found for: HDL No results found for: LDLCALC No results found for: TRIG No results found for: CHOLHDL No results found for: LDLDIRECT    Radiology: DG Chest 2 View  Result Date: 12/13/2019 CLINICAL DATA:  Fever, history of negative COVID test. EXAM: CHEST - 2 VIEW COMPARISON:  None FINDINGS: Cardiomediastinal contours and hilar structures are normal. Lungs are clear. No pleural effusion. Visualized skeletal structures are unremarkable. IMPRESSION: No acute cardiopulmonary disease. Electronically Signed   By: Donzetta Kohut M.D.   On: 12/13/2019 14:01    EKG: Sinus rhythm, incomplete right bundle branch block, nondiagnostic ST elevation in lead aVL  ASSESSMENT AND PLAN:   1.  Chest pain, pleuritic in nature, in the setting of febrile flulike illness, reportedly negative COVID-19, with markedly elevated high sensitive troponin of 5,108, with clinical presentation and timeline more consistent with myocarditis, unlikely due to acute coronary syndrome or SCAD  Recommendations  1.  Defer emergent cardiac catheterization 2.  Defer heparin bolus and drip 3.  Stat 2D echocardiogram 4.  Transfer to [facility] for cardiac MRI, discussed with Dr. Eden Emms who will accept in transfer  Signed: Marcina Millard MD,PhD, [facility] 12/13/2019, 2:11 PM

## 2019-12-13 NOTE — ED Notes (Signed)
Cardiologist at bedside at this time.

## 2019-12-14 ENCOUNTER — Inpatient Hospital Stay (HOSPITAL_COMMUNITY)
Admission: AD | Admit: 2019-12-14 | Discharge: 2019-12-15 | DRG: 315 | Disposition: A | Discharge status: Home / Self Care | Payer: BC Managed Care – PPO | Source: Other Acute Inpatient Hospital | Attending: Cardiovascular Disease | Admitting: Cardiovascular Disease

## 2019-12-14 ENCOUNTER — Encounter (HOSPITAL_COMMUNITY): Payer: Self-pay | Admitting: Internal Medicine

## 2019-12-14 ENCOUNTER — Other Ambulatory Visit: Payer: Self-pay

## 2019-12-14 DIAGNOSIS — R079 Chest pain, unspecified: Secondary | ICD-10-CM | POA: Diagnosis present

## 2019-12-14 DIAGNOSIS — I514 Myocarditis, unspecified: Secondary | ICD-10-CM | POA: Diagnosis present

## 2019-12-14 DIAGNOSIS — R0789 Other chest pain: Secondary | ICD-10-CM | POA: Diagnosis not present

## 2019-12-14 DIAGNOSIS — R202 Paresthesia of skin: Secondary | ICD-10-CM | POA: Diagnosis not present

## 2019-12-14 DIAGNOSIS — I313 Pericardial effusion (noninflammatory): Secondary | ICD-10-CM | POA: Diagnosis present

## 2019-12-14 DIAGNOSIS — R509 Fever, unspecified: Secondary | ICD-10-CM | POA: Diagnosis present

## 2019-12-14 DIAGNOSIS — F17200 Nicotine dependence, unspecified, uncomplicated: Secondary | ICD-10-CM | POA: Diagnosis not present

## 2019-12-14 DIAGNOSIS — I409 Acute myocarditis, unspecified: Secondary | ICD-10-CM | POA: Diagnosis not present

## 2019-12-14 DIAGNOSIS — R200 Anesthesia of skin: Secondary | ICD-10-CM | POA: Diagnosis not present

## 2019-12-14 DIAGNOSIS — I429 Cardiomyopathy, unspecified: Secondary | ICD-10-CM | POA: Diagnosis present

## 2019-12-14 DIAGNOSIS — Z79899 Other long term (current) drug therapy: Secondary | ICD-10-CM

## 2019-12-14 DIAGNOSIS — Z20822 Contact with and (suspected) exposure to covid-19: Secondary | ICD-10-CM | POA: Diagnosis not present

## 2019-12-14 DIAGNOSIS — F17210 Nicotine dependence, cigarettes, uncomplicated: Secondary | ICD-10-CM | POA: Diagnosis not present

## 2019-12-14 DIAGNOSIS — I400 Infective myocarditis: Secondary | ICD-10-CM

## 2019-12-14 DIAGNOSIS — R799 Abnormal finding of blood chemistry, unspecified: Secondary | ICD-10-CM | POA: Diagnosis not present

## 2019-12-14 DIAGNOSIS — R05 Cough: Secondary | ICD-10-CM | POA: Diagnosis not present

## 2019-12-14 LAB — TROPONIN I (HIGH SENSITIVITY): Troponin I (High Sensitivity): 3713 ng/L (ref <18)

## 2019-12-14 MED ORDER — ENOXAPARIN SODIUM 40 MG/0.4ML ~~LOC~~ SOLN
40.0000 mg | SUBCUTANEOUS | Status: DC
  Administered 2019-12-14: 40 mg via SUBCUTANEOUS
  Filled 2019-12-14: qty 0.4

## 2019-12-14 MED ORDER — ACETAMINOPHEN 500 MG PO TABS
ORAL_TABLET | ORAL | Status: AC
  Filled 2019-12-14: qty 2

## 2019-12-14 MED ORDER — ACETAMINOPHEN 500 MG PO TABS
1000.0000 mg | ORAL_TABLET | Freq: Once | ORAL | Status: AC
  Administered 2019-12-14: 1000 mg via ORAL

## 2019-12-14 MED ORDER — ACETAMINOPHEN 325 MG PO TABS: 650.0000 mg | ORAL_TABLET | ORAL | Status: DC | PRN

## 2019-12-14 MED ORDER — PANTOPRAZOLE SODIUM 40 MG PO TBEC
40.0000 mg | DELAYED_RELEASE_TABLET | Freq: Every day | ORAL | Status: DC
  Administered 2019-12-14 – 2019-12-15 (×2): 40 mg via ORAL
  Filled 2019-12-14 (×2): qty 1

## 2019-12-14 MED ORDER — METOPROLOL TARTRATE 12.5 MG HALF TABLET
12.5000 mg | ORAL_TABLET | Freq: Two times a day (BID) | ORAL | Status: DC
  Administered 2019-12-14 – 2019-12-15 (×2): 12.5 mg via ORAL
  Filled 2019-12-14 (×2): qty 1

## 2019-12-14 MED ORDER — INDOMETHACIN 25 MG PO CAPS
25.0000 mg | ORAL_CAPSULE | Freq: Three times a day (TID) | ORAL | Status: DC
  Administered 2019-12-14: 25 mg via ORAL
  Filled 2019-12-14 (×3): qty 1

## 2019-12-14 MED ORDER — SODIUM CHLORIDE 0.9% FLUSH
3.0000 mL | Freq: Two times a day (BID) | INTRAVENOUS | Status: DC
  Administered 2019-12-14: 22:00:00 3 mL via INTRAVENOUS

## 2019-12-14 MED ORDER — SODIUM CHLORIDE 0.9 % IV SOLN: 250.0000 mL | INTRAVENOUS | Status: DC | PRN

## 2019-12-14 MED ORDER — PANTOPRAZOLE SODIUM 40 MG PO TBEC
40.0000 mg | DELAYED_RELEASE_TABLET | Freq: Every day | ORAL | Status: DC
  Administered 2019-12-14: 40 mg via ORAL
  Filled 2019-12-14: qty 1

## 2019-12-14 MED ORDER — SODIUM CHLORIDE 0.9% FLUSH: 3.0000 mL | INTRAVENOUS | Status: DC | PRN

## 2019-12-14 MED ORDER — COLCHICINE 0.6 MG PO TABS
0.6000 mg | ORAL_TABLET | Freq: Two times a day (BID) | ORAL | Status: DC
  Administered 2019-12-14 – 2019-12-15 (×2): 0.6 mg via ORAL
  Filled 2019-12-14 (×2): qty 1

## 2019-12-14 MED ORDER — INDOMETHACIN 25 MG PO CAPS
50.0000 mg | ORAL_CAPSULE | Freq: Three times a day (TID) | ORAL | Status: DC
  Administered 2019-12-14 – 2019-12-15 (×2): 50 mg via ORAL
  Filled 2019-12-14 (×4): qty 2

## 2019-12-14 MED ORDER — ONDANSETRON HCL 4 MG/2ML IJ SOLN: 4.0000 mg | Freq: Four times a day (QID) | INTRAMUSCULAR | Status: DC | PRN

## 2019-12-14 NOTE — H&P (Signed)
Physician History and Physical     Patient ID: Steven Sanchez MRN: [id number] DOB/AGE: [birth year] 34 y.o. Admit date: 12/14/2019  Primary Care Physician: Mar Daring, PA-C Primary Cardiologist: Will be Dr Fletcher Anon in [facility]  Active Problems:   Myocarditis [facility])   HPI:  34 y.o. with no previous cardiac history. 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 Started having flu like symptoms Thursday. With chills and myalgias. Temp to 103. No focal infectious signs. Has not had COVID vaccine and tested negative in [place] ER. No focal infectious signs. Consulted on by Dr Leanna Sato Cardiology Saturday ? Acute MI. Had lateral J point elevation with peak troponin 7861. TTE read by myself showed no RWMA with mild diffuse hypokinesis EF 50-55%.  Had another fever to 103 Saturday. In ER chest pain resolved. Presumptive diagnosis of myocarditis. Rx with tylenol indocin and colchicine. CXR NAD and UA negative. WBC normal as was lactic acid. No sick contacts. Has been asymptomatic with no ECG changes and transferred to [facility] tonight for cardiac MRI and likely cardiac CT Monday F/U ECG today shows diffuse ST J point elevation consistent with pericarditis and no PR depression Troponin coming down 3713  Review of systems complete and found to be negative unless listed above   History reviewed. No pertinent past medical history.  Family History  Problem Relation Age of Onset  . Hypertension Father     Social History   Socioeconomic History  . Marital status: Single    Spouse name: Not on file  . Number of children: Not on file  . Years of education: Not on file  . Highest education level: Not on file  Occupational History  . Occupation: Tennis pro  Tobacco Use  . Smoking status: Current Some Day Smoker  . Smokeless tobacco: Never Used  Substance and Sexual Activity  . Alcohol use: Yes    Alcohol/week: 5.0 standard drinks    Types: 5 Cans of beer per week    Comment:  occasional  . Drug use: No  . Sexual activity: Yes  Other Topics Concern  . Not on file  Social History Narrative   Lives with wife and one step-daughter   Social Determinants of Health   Financial Resource Strain:   . Difficulty of Paying Living Expenses:   Food Insecurity:   . Worried About Charity fundraiser in the Last Year:   . Arboriculturist in the Last Year:   Transportation Needs:   . Film/video editor (Medical):   Marland Kitchen Lack of Transportation (Non-Medical):   Physical Activity:   . Days of Exercise per Week:   . Minutes of Exercise per Session:   Stress:   . Feeling of Stress :   Social Connections:   . Frequency of Communication with Friends and Family:   . Frequency of Social Gatherings with Friends and Family:   . Attends Religious Services:   . Active Member of Clubs or Organizations:   . Attends Archivist Meetings:   Marland Kitchen Marital Status:   Intimate Partner Violence:   . Fear of Current or Ex-Partner:   . Emotionally Abused:   Marland Kitchen Physically Abused:   . Sexually Abused:     History reviewed. No pertinent surgical history.   Medications Prior to Admission  Medication Sig Dispense Refill Last Dose  . acetaminophen (TYLENOL) 325 MG tablet Take 650 mg by mouth every 6 (six) hours as needed.   Unknown at Unknown time  .  amoxicillin-clavulanate (AUGMENTIN) 875-125 MG tablet Take 1 tablet by mouth 2 (two) times daily.   More than a month at Unknown time    Physical Exam: Blood pressure 109/89, pulse 82, temperature 99.3 F (37.4 C), temperature source Oral, resp. rate 20, height 5' 7"  (1.702 m), weight 66 kg.    Affect appropriate Healthy:  appears stated age 34: normal Neck supple with no adenopathy JVP normal no bruits no thyromegaly Lungs clear with no wheezing and good diaphragmatic motion Heart:  S1/S2 no murmur, no rub, gallop or click PMI normal Abdomen: benighn, BS positve, no tenderness, no AAA no bruit.  No HSM or HJR Distal pulses  intact with no bruits No edema Neuro non-focal Skin warm and dry No muscular weakness  No current facility-administered medications on file prior to encounter.   Current Outpatient Medications on File Prior to Encounter  Medication Sig Dispense Refill  . acetaminophen (TYLENOL) 325 MG tablet Take 650 mg by mouth every 6 (six) hours as needed.    Marland Kitchen amoxicillin-clavulanate (AUGMENTIN) 875-125 MG tablet Take 1 tablet by mouth 2 (two) times daily.      Labs:   Lab Results  Component Value Date   WBC 7.8 12/13/2019   HGB 15.5 12/13/2019   HCT 44.2 12/13/2019   MCV 86.7 12/13/2019   PLT 148 (L) 12/13/2019    Recent Labs  Lab 12/13/19 1249  NA 136  K 3.5  CL 104  CO2 26  BUN 18  CREATININE 0.99  CALCIUM 9.0  GLUCOSE 113*       Radiology: DG Chest 2 View  Result Date: 12/13/2019 CLINICAL DATA:  Fever, history of negative COVID test. EXAM: CHEST - 2 VIEW COMPARISON:  None FINDINGS: Cardiomediastinal contours and hilar structures are normal. Lungs are clear. No pleural effusion. Visualized skeletal structures are unremarkable. IMPRESSION: No acute cardiopulmonary disease. Electronically Signed   By: Zetta Bills M.D.   On: 12/13/2019 14:01   ECHOCARDIOGRAM COMPLETE  Result Date: 12/13/2019    ECHOCARDIOGRAM REPORT   Patient Name:   Steven Sanchez Date of Exam: 12/13/2019 Medical Rec #:  [id number]       Height:       67.0 in Accession #:    [id number]      Weight:       150.0 lb Date of Birth:  [birth year]       BSA:          1.790 m Patient Age:    34 years        BP:           114/81 mmHg Patient Gender: M               HR:           93 bpm. Exam Location:  [facility] Procedure: 2D Echo Indications:     Elevated Troponin  History:         Patient has no prior history of Echocardiogram examinations.  Sonographer:     Arville Go RDCS Referring Phys:  [place] Phys: Jenkins Rouge MD IMPRESSIONS  1. Mild diffuse hypokinesis no defined RWMA. Left ventricular  ejection fraction, by estimation, is 45 to 50%. The left ventricle has mildly decreased function. The left ventricle demonstrates global hypokinesis. Left ventricular diastolic parameters were normal.  2. Right ventricular systolic function is normal. The right ventricular size is normal.  3. The mitral valve is normal in structure. No evidence of mitral valve regurgitation.  4. The aortic valve is tricuspid. Aortic valve regurgitation is not visualized. No aortic stenosis is present. FINDINGS  Left Ventricle: Mild diffuse hypokinesis no defined RWMA. Left ventricular ejection fraction, by estimation, is 45 to 50%. The left ventricle has mildly decreased function. The left ventricle demonstrates global hypokinesis. The left ventricular internal cavity size was normal in size. There is no left ventricular hypertrophy. Left ventricular diastolic parameters were normal. Right Ventricle: The right ventricular size is normal. No increase in right ventricular wall thickness. Right ventricular systolic function is normal. Left Atrium: Left atrial size was normal in size. Right Atrium: Right atrial size was normal in size. Pericardium: There is no evidence of pericardial effusion. Mitral Valve: The mitral valve is normal in structure. No evidence of mitral valve regurgitation. Tricuspid Valve: The tricuspid valve is normal in structure. Tricuspid valve regurgitation is mild. Aortic Valve: The aortic valve is tricuspid. Aortic valve regurgitation is not visualized. No aortic stenosis is present. Aortic valve peak gradient measures 5.5 mmHg. Pulmonic Valve: The pulmonic valve was normal in structure. Pulmonic valve regurgitation is not visualized. Aorta: The aortic root is normal in size and structure. IAS/Shunts: No atrial level shunt detected by color flow Doppler.  LEFT VENTRICLE PLAX 2D LVIDd:         5.45 cm      Diastology LVIDs:         4.03 cm      LV e' lateral:   10.10 cm/s LV PW:         1.07 cm      LV E/e'  lateral: 6.5 LV IVS:        1.08 cm      LV e' medial:    10.60 cm/s LVOT diam:     2.10 cm      LV E/e' medial:  6.2 LV SV:         61 LV SV Index:   34 LVOT Area:     3.46 cm  LV Volumes (MOD) LV vol d, MOD A2C: 87.3 ml LV vol d, MOD A4C: 101.0 ml LV vol s, MOD A2C: 52.7 ml LV vol s, MOD A4C: 50.1 ml LV SV MOD A2C:     34.6 ml LV SV MOD A4C:     101.0 ml LV SV MOD BP:      42.2 ml RIGHT VENTRICLE RV Basal diam:  2.98 cm RV S prime:     13.90 cm/s TAPSE (M-mode): 2.4 cm LEFT ATRIUM             Index       RIGHT ATRIUM          Index LA diam:        3.00 cm 1.68 cm/m  RA Area:     9.38 cm LA Vol (A2C):   29.9 ml 16.71 ml/m RA Volume:   21.40 ml 11.96 ml/m LA Vol (A4C):   16.4 ml 9.16 ml/m LA Biplane Vol: 23.9 ml 13.36 ml/m  AORTIC VALVE AV Area (Vmax): 2.87 cm AV Vmax:        117.00 cm/s AV Peak Grad:   5.5 mmHg LVOT Vmax:      96.80 cm/s LVOT Vmean:     61.400 cm/s LVOT VTI:       0.175 m  AORTA Ao Root diam: 3.30 cm Ao Asc diam:  3.10 cm MITRAL VALVE MV Area (PHT): 4.80 cm    SHUNTS MV Decel Time: 158 msec    Systemic  VTI:  0.18 m MV E velocity: 66.00 cm/s  Systemic Diam: 2.10 cm MV A velocity: 53.40 cm/s MV E/A ratio:  1.24 Jenkins Rouge MD Electronically signed by Jenkins Rouge MD Signature Date/Time: 12/13/2019/5:45:32 PM    Final     EKG: See HPI today SR rate 88 pericarditis   ASSESSMENT AND PLAN:   1. Myocarditis:  Presentation not consistent with acute MI from epicardial CAD. Currently asymptomatic being Rx with tylenol, indocin and colchicine.  Will have cardiac MRI in am to further confirm diagnosis and make recommendations for activity level post d/c. He has not had any arrhythmias on telemetry and EF is low normal by TTE At 50-55%.  WBC, CXR, UA not consistent with bacterial infectious fever. Would also check ESR, CRP, ECG and troponin in am. He is friends with Dr Fletcher Anon in [place] and will likely f/u with him post d/c. For completeness sake would also consider cardiac CTA while  inpatient to assess his coronaries although as stated presentation and resolution of symptoms not consistent with obstructive epicardial CAD or SCAD .     SignedCollier Salina Nishan4/25/2021, 5:54 PM

## 2019-12-14 NOTE — ED Notes (Signed)
Left with [facility]. All of belongings sent with patient. Pt stable upon discharge.

## 2019-12-14 NOTE — Progress Notes (Signed)
Subjective:  No chest pain palpitations or dyspnea  Objective:  Vitals:   12/14/19 0318 12/14/19 0428 12/14/19 0624 12/14/19 0654  BP: 100/74 (!) 81/62 110/69 107/72  Pulse: 97 82 93 92  Resp: 16 17 17 16   Temp:    98.8 F (37.1 C)  TempSrc:    Oral  SpO2: 98% 97% 99% 98%  Weight:      Height:        Intake/Output from previous day:  Intake/Output Summary (Last 24 hours) at 12/14/2019 0912 Last data filed at 12/13/2019 1437 Gross per 24 hour  Intake 1000 ml  Output 475 ml  Net 525 ml    Physical Exam: Affect appropriate Healthy:  appears stated age HEENT: normal Neck supple with no adenopathy JVP normal no bruits no thyromegaly Lungs clear with no wheezing and good diaphragmatic motion Heart:  S1/S2 no murmur, no rub, gallop or click PMI normal Abdomen: benighn, BS positve, no tenderness, no AAA no bruit.  No HSM or HJR Distal pulses intact with no bruits No edema Neuro non-focal Skin warm and dry No muscular weakness   Lab Results: Basic Metabolic Panel: Recent Labs    12/13/19 1249  NA 136  K 3.5  CL 104  CO2 26  GLUCOSE 113*  BUN 18  CREATININE 0.99  CALCIUM 9.0   Liver Function Tests: No results for input(s): AST, ALT, ALKPHOS, BILITOT, PROT, ALBUMIN in the last 72 hours. No results for input(s): LIPASE, AMYLASE in the last 72 hours. CBC: Recent Labs    12/13/19 1249  WBC 7.8  HGB 15.5  HCT 44.2  MCV 86.7  PLT 148*    Imaging: DG Chest 2 View  Result Date: 12/13/2019 CLINICAL DATA:  Fever, history of negative COVID test. EXAM: CHEST - 2 VIEW COMPARISON:  None FINDINGS: Cardiomediastinal contours and hilar structures are normal. Lungs are clear. No pleural effusion. Visualized skeletal structures are unremarkable. IMPRESSION: No acute cardiopulmonary disease. Electronically Signed   By: 12/15/2019 M.D.   On: 12/13/2019 14:01   ECHOCARDIOGRAM COMPLETE  Result Date: 12/13/2019    ECHOCARDIOGRAM REPORT   Patient Name:   Steven Sanchez Date of Exam: 12/13/2019 Medical Rec #:  [id number]       Height:       67.0 in Accession #:    [id number]      Weight:       150.0 lb Date of Birth:  Jul 26, 1986       BSA:          1.790 m Patient Age:    33 years        BP:           114/81 mmHg Patient Gender: M               HR:           93 bpm. Exam Location:  [facility] Procedure: 2D Echo Indications:     Elevated Troponin  History:         Patient has no prior history of Echocardiogram examinations.  Sonographer:     03/17/1988 RDCS Referring Phys:  Wonda Cerise [id number] Diagnosing Phys: Wendall Stade MD IMPRESSIONS  1. Mild diffuse hypokinesis no defined RWMA. Left ventricular ejection fraction, by estimation, is 45 to 50%. The left ventricle has mildly decreased function. The left ventricle demonstrates global hypokinesis. Left ventricular diastolic parameters were normal.  2. Right ventricular systolic function is normal. The right ventricular  size is normal.  3. The mitral valve is normal in structure. No evidence of mitral valve regurgitation.  4. The aortic valve is tricuspid. Aortic valve regurgitation is not visualized. No aortic stenosis is present. FINDINGS  Left Ventricle: Mild diffuse hypokinesis no defined RWMA. Left ventricular ejection fraction, by estimation, is 45 to 50%. The left ventricle has mildly decreased function. The left ventricle demonstrates global hypokinesis. The left ventricular internal cavity size was normal in size. There is no left ventricular hypertrophy. Left ventricular diastolic parameters were normal. Right Ventricle: The right ventricular size is normal. No increase in right ventricular wall thickness. Right ventricular systolic function is normal. Left Atrium: Left atrial size was normal in size. Right Atrium: Right atrial size was normal in size. Pericardium: There is no evidence of pericardial effusion. Mitral Valve: The mitral valve is normal in structure. No evidence of mitral valve regurgitation. Tricuspid  Valve: The tricuspid valve is normal in structure. Tricuspid valve regurgitation is mild. Aortic Valve: The aortic valve is tricuspid. Aortic valve regurgitation is not visualized. No aortic stenosis is present. Aortic valve peak gradient measures 5.5 mmHg. Pulmonic Valve: The pulmonic valve was normal in structure. Pulmonic valve regurgitation is not visualized. Aorta: The aortic root is normal in size and structure. IAS/Shunts: No atrial level shunt detected by color flow Doppler.  LEFT VENTRICLE PLAX 2D LVIDd:         5.45 cm      Diastology LVIDs:         4.03 cm      LV e' lateral:   10.10 cm/s LV PW:         1.07 cm      LV E/e' lateral: 6.5 LV IVS:        1.08 cm      LV e' medial:    10.60 cm/s LVOT diam:     2.10 cm      LV E/e' medial:  6.2 LV SV:         61 LV SV Index:   34 LVOT Area:     3.46 cm  LV Volumes (MOD) LV vol d, MOD A2C: 87.3 ml LV vol d, MOD A4C: 101.0 ml LV vol s, MOD A2C: 52.7 ml LV vol s, MOD A4C: 50.1 ml LV SV MOD A2C:     34.6 ml LV SV MOD A4C:     101.0 ml LV SV MOD BP:      42.2 ml RIGHT VENTRICLE RV Basal diam:  2.98 cm RV S prime:     13.90 cm/s TAPSE (M-mode): 2.4 cm LEFT ATRIUM             Index       RIGHT ATRIUM          Index LA diam:        3.00 cm 1.68 cm/m  RA Area:     9.38 cm LA Vol (A2C):   29.9 ml 16.71 ml/m RA Volume:   21.40 ml 11.96 ml/m LA Vol (A4C):   16.4 ml 9.16 ml/m LA Biplane Vol: 23.9 ml 13.36 ml/m  AORTIC VALVE AV Area (Vmax): 2.87 cm AV Vmax:        117.00 cm/s AV Peak Grad:   5.5 mmHg LVOT Vmax:      96.80 cm/s LVOT Vmean:     61.400 cm/s LVOT VTI:       0.175 m  AORTA Ao Root diam: 3.30 cm Ao Asc diam:  3.10 cm MITRAL  VALVE MV Area (PHT): 4.80 cm    SHUNTS MV Decel Time: 158 msec    Systemic VTI:  0.18 m MV E velocity: 66.00 cm/s  Systemic Diam: 2.10 cm MV A velocity: 53.40 cm/s MV E/A ratio:  1.24 Jenkins Rouge MD Electronically signed by Jenkins Rouge MD Signature Date/Time: 12/13/2019/5:45:32 PM    Final     Cardiac Studies:  ECG: SR lateral J  point elevation no PR depression   Telemetry:  NSR no arrhythmia or PVC  Echo: EF 50-55% no focal RWMA  Medications:   . indomethacin  25 mg Oral TID WC  . pantoprazole  40 mg Oral Daily  . sodium chloride flush  3 mL Intravenous Once      Assessment/Plan:   1. Chest Pain:  Likely myocarditis. No sick contacts COVID negative and did not receive the vaccine Have been working since 2:00 yesterday to get him a telemetry bed at [facility]. 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 Have written for f/u ECG/Troponin this am Rx with indocin and colchicine would also favor cardiac CTA to r/o CAD and anomaly If he is still at [facility] in am will sign out to Dr Fletcher Anon possible heart cath to do this and make NPO in am . Use Tylenol for fever. No other focal infectious signs    Jenkins Rouge 12/14/2019, 9:12 AM

## 2019-12-14 NOTE — ED Notes (Signed)
Pt given meal tray at this time, medications administered with meal tray.

## 2019-12-14 NOTE — Progress Notes (Signed)
Patient arrived 1700 hrs via [facility].  Oriented to unit and plan of care for shift, verbalized understanding. Cardiologist notified of arrival, see orders.

## 2019-12-14 NOTE — ED Notes (Signed)
[facility] at bedside 

## 2019-12-14 NOTE — ED Notes (Signed)
Cardiologist at bedside at this time.

## 2019-12-14 NOTE — ED Notes (Signed)
Meds administered per order, repeat EKG and repeat Trop obtained by this RN. This RN apologized and explained delay. Pt states understanding. Call bell within reach. Pt's wife at bedside. VSS at this time.

## 2019-12-14 NOTE — ED Notes (Signed)
This RN set patient up to clean himself at bedside. Pt's wife remains at bedside. Pt given clean gown and socks for when he is finished. Pt denies any needs or needing assistance from this RN. Pt's wife remains at bedside. Pt's wife states understanding to call out should any needs arise.

## 2019-12-14 NOTE — ED Notes (Signed)
This RN to bedside at this time, pt resting in bed with wife at bedside. This RN spoke with patient at this time, pt given clean gown by this RN. Pt requesting materials to clean up. Explained would bring it to patient. Pt states understanding at this time. Pt visualized in NAD. Pt denies further needs. VSS at this time.

## 2019-12-14 NOTE — ED Notes (Signed)
BP cuff and O2 sensor placed back on patient. Pt visualized in NAD at this time. Pt denies any needs at this time. Call bell remains within reach. Pt resting in bed watching TV.

## 2019-12-15 ENCOUNTER — Telehealth: Payer: Self-pay | Admitting: *Deleted

## 2019-12-15 ENCOUNTER — Other Ambulatory Visit: Payer: Self-pay | Admitting: Medical

## 2019-12-15 ENCOUNTER — Inpatient Hospital Stay (HOSPITAL_COMMUNITY): Payer: BC Managed Care – PPO

## 2019-12-15 DIAGNOSIS — I514 Myocarditis, unspecified: Secondary | ICD-10-CM

## 2019-12-15 DIAGNOSIS — R079 Chest pain, unspecified: Secondary | ICD-10-CM

## 2019-12-15 DIAGNOSIS — I409 Acute myocarditis, unspecified: Secondary | ICD-10-CM | POA: Diagnosis not present

## 2019-12-15 DIAGNOSIS — I429 Cardiomyopathy, unspecified: Secondary | ICD-10-CM

## 2019-12-15 DIAGNOSIS — R509 Fever, unspecified: Secondary | ICD-10-CM

## 2019-12-15 DIAGNOSIS — I400 Infective myocarditis: Secondary | ICD-10-CM

## 2019-12-15 DIAGNOSIS — I313 Pericardial effusion (noninflammatory): Secondary | ICD-10-CM | POA: Diagnosis not present

## 2019-12-15 LAB — CBC WITH DIFFERENTIAL/PLATELET
Abs Immature Granulocytes: 0.01 10*3/uL (ref 0.00–0.07)
Basophils Absolute: 0 10*3/uL (ref 0.0–0.1)
Basophils Relative: 1 %
Eosinophils Absolute: 0 10*3/uL (ref 0.0–0.5)
Eosinophils Relative: 0 %
HCT: 39.6 % (ref 39.0–52.0)
Hemoglobin: 13.8 g/dL (ref 13.0–17.0)
Immature Granulocytes: 0 %
Lymphocytes Relative: 20 %
Lymphs Abs: 0.6 10*3/uL — ABNORMAL LOW (ref 0.7–4.0)
MCH: 30.1 pg (ref 26.0–34.0)
MCHC: 34.8 g/dL (ref 30.0–36.0)
MCV: 86.5 fL (ref 80.0–100.0)
Monocytes Absolute: 0.4 10*3/uL (ref 0.1–1.0)
Monocytes Relative: 13 %
Neutro Abs: 2 10*3/uL (ref 1.7–7.7)
Neutrophils Relative %: 66 %
Platelets: 92 10*3/uL — ABNORMAL LOW (ref 150–400)
RBC: 4.58 MIL/uL (ref 4.22–5.81)
RDW: 12.1 % (ref 11.5–15.5)
WBC: 3.1 10*3/uL — ABNORMAL LOW (ref 4.0–10.5)
nRBC: 0 % (ref 0.0–0.2)

## 2019-12-15 LAB — HEPATIC FUNCTION PANEL
ALT: 37 U/L (ref 0–44)
AST: 72 U/L — ABNORMAL HIGH (ref 15–41)
Albumin: 3.3 g/dL — ABNORMAL LOW (ref 3.5–5.0)
Alkaline Phosphatase: 47 U/L (ref 38–126)
Bilirubin, Direct: 0.1 mg/dL (ref 0.0–0.2)
Indirect Bilirubin: 0.8 mg/dL (ref 0.3–0.9)
Total Bilirubin: 0.9 mg/dL (ref 0.3–1.2)
Total Protein: 5.8 g/dL — ABNORMAL LOW (ref 6.5–8.1)

## 2019-12-15 LAB — C-REACTIVE PROTEIN: CRP: 9.9 mg/dL — ABNORMAL HIGH (ref <1.0)

## 2019-12-15 LAB — HIV ANTIBODY (ROUTINE TESTING W REFLEX): HIV Screen 4th Generation wRfx: NONREACTIVE

## 2019-12-15 LAB — SEDIMENTATION RATE: Sed Rate: 6 mm/hr (ref 0–16)

## 2019-12-15 LAB — TSH: TSH: 1.162 u[IU]/mL (ref 0.350–4.500)

## 2019-12-15 MED ORDER — COLCHICINE 0.6 MG PO TABS: 0.6000 mg | ORAL_TABLET | Freq: Two times a day (BID) | ORAL | 0 refills | Status: DC

## 2019-12-15 MED ORDER — COLCHICINE 0.6 MG PO CAPS: 0.6000 mg | ORAL_CAPSULE | Freq: Two times a day (BID) | ORAL | 0 refills | Status: DC

## 2019-12-15 MED ORDER — GADOBUTROL 1 MMOL/ML IV SOLN
10.0000 mL | Freq: Once | INTRAVENOUS | Status: AC | PRN
  Administered 2019-12-15: 10 mL via INTRAVENOUS

## 2019-12-15 MED ORDER — METOPROLOL TARTRATE 25 MG PO TABS: 12.5000 mg | ORAL_TABLET | Freq: Two times a day (BID) | ORAL | 6 refills | Status: DC

## 2019-12-15 MED ORDER — PANTOPRAZOLE SODIUM 40 MG PO TBEC: 40.0000 mg | DELAYED_RELEASE_TABLET | Freq: Every day | ORAL | 1 refills | Status: DC

## 2019-12-15 MED ORDER — INDOMETHACIN 50 MG PO CAPS: 50.0000 mg | ORAL_CAPSULE | Freq: Three times a day (TID) | ORAL | 0 refills | Status: AC

## 2019-12-15 NOTE — Care Management (Signed)
12-15-19 1117 Case Manager spoke with patient regarding insurance coverage. Patient has BCBS- card sent to admitting to place in Epic. Case Manager will submit benefits check for colchicine. Patient states he has a primary care provider at [facility]. Case Manager will continue to follow for additional transition of care needs. Gala Lewandowsky, RN,BSN Case Manager

## 2019-12-15 NOTE — TOC Benefit Eligibility Note (Signed)
Transition of Care [facility]) Benefit Eligibility Note    Patient Details  Name: Steven Sanchez MRN: [id number] Date of Birth: [birth year]   Medication/Dose: COLCHICINE  0.6 MG BID  Covered?: (NON-FORMULARY)     Prescription Coverage [facility] [address]  [facility]  Spoke with Person/Company/Phone Number:: JENNIFER  @   [employer]   # [phone]     Prior Approval: Yes(#  [phone] OPT-3, OPT- 2)     Additional Notes: ALTERNATIVE : MITIGAR  0.6 MG BID COVER- YES, CO-PAY- $25.00 TIER-2 DRUG P/A -Ricky Ala Phone Number: 12/15/2019, 1:00 PM

## 2019-12-15 NOTE — Care Management (Signed)
12-15-19 1345 Case Manager called [facility] to price check the colchicine and the co pay is $765.00. Case Manager notified PA about cost to see if alternative medication can be sent into pharmacy. [facility] Mitigar. Once the prescription is written the pharmacy can order more for the patient to pick up. No further needs from Case Manager at this time. Graves-Bigelow, Lamar Laundry, RN, BSN Case Manager

## 2019-12-15 NOTE — Telephone Encounter (Signed)
-----   Message from Cadence David Stall, PA-C sent at 12/15/2019 12:44 PM EDT ----- Regarding: Follow-up labs and echo Please call and arrange labs (HS trop and CRP) in about 2 weeks and a limited 2d echo in 1 month. He is going to be scheduled for a TOC apt with Dr. Kirke Corin in about 2-3 weeks and labs need to be done a couple days prior to that.   Thanks

## 2019-12-15 NOTE — Discharge Summary (Addendum)
Discharge Summary    Patient ID: Steven Sanchez MRN: [id number]; DOB: 02-Apr-1986  Admit date: 12/14/2019 Discharge date: 12/15/2019  Primary Care Provider: Margaretann Loveless, PA-C  Primary Cardiologist: Lorine Bears, MD  Primary Electrophysiologist:  None   Discharge Diagnoses    Principal Problem:   Myocarditis [facility]) Active Problems:   Cardiomyopathy [facility])   Fever   Chest pain   Diagnostic Studies/Procedures    Cardiac MRI 12/15/19 IMPRESSION: 1. Findings consistent with myopericarditis with uptake of gadolinium in myocardium that does not correspond to coronary distribution  2.  Small pericardial effusion  3.  Quantitative EF 50% with mild diffuse hypokinesis  4.  Normal RV size and function  5.  Normal cardiac valves  Echo 10/15/19 1. Mild diffuse hypokinesis no defined RWMA. Left ventricular ejection  fraction, by estimation, is 45 to 50%. The left ventricle has mildly  decreased function. The left ventricle demonstrates global hypokinesis.  Left ventricular diastolic parameters  were normal.  2. Right ventricular systolic function is normal. The right ventricular  size is normal.  3. The mitral valve is normal in structure. No evidence of mitral valve  regurgitation.  4. The aortic valve is tricuspid. Aortic valve regurgitation is not  visualized. No aortic stenosis is present.  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 The patient began to have flu-like symptoms about 1-2 days prior to presentation. His temperature was up to 103 and he had body aches. Chest pain was sharp and substernal with associated numbness and tingling. Pleuritic in nature. Denied vomiting and diarrhea. No shortness of breath. Patient first went to an Urgent Care and tested negative for COVID. He had not had  his covid vaccine yet.   Hospital Course     Consultants: None  In the ED patient was febrile. CXR unremarkable. UA negative. COVID negative. EKG showed minimal ST elevation in aVL read as STEMI. HS troponin 7,153>5,108. All other labs were unremarkable.  Dr. Cassie Freer from [facility] was consulted who recommended admission at [facility] for possible Myocarditis. Stat echo showed EF 45-50% with global hypokinesis. Chest pain resolved while in the ER. IV heparin was not started. Dr. Eden Emms was contacted who agreed to admit the patient. The patient arrived to [facility] in stable condition. He was started on Indomethacin and colchicine. Pantoprazole also started. Follow up EKG showed diffuse ST J point elevation consistent with pericarditis and no PR depression. HS troponin started down trending. Cardiac MRI showed findings consistent with myopericarditis with uptake of gadoliniem in myocardiam that does not correspond to coronary distribution, small pericardial effusion, EF 50% and normal valves. The next day patient felt much better. He had no further chest pain and was afebrile. Will plan to discharge the patient with Indocin for 3 more days and colchicine for the next 3 months. Continue pantoprazole while on antiinflammatories. Continue Metoprolol 12.5mg  BID. Will order follow-up labs in 2 weeks and a follow-up 2D echo in 1 month arrange TOC follow-up with Dr. Kirke Corin. Patient was advised to avoid strenuous or lengthy physical activity.   The patient was evaluated by Dr. Rubie Maid 12/15/19 and felt to be stable for discharge.  Did the patient have an acute coronary syndrome (MI, NSTEMI, STEMI, etc) this admission?:  No  Did the patient have a percutaneous coronary intervention (stent / angioplasty)?:  No.   _____________  Discharge Vitals Blood pressure 111/81, pulse 81, temperature (!) 97.5 F (36.4 C), temperature source Oral, resp. rate 19, height 5\' 7"  (1.702 m), weight 63.2 kg,  SpO2 100 %.  Filed Weights   12/14/19 1706 12/15/19 0321  Weight: 66 kg 63.2 kg    Labs & Radiologic Studies    CBC Recent Labs    12/13/19 1249 12/15/19 0314  WBC 7.8 3.1*  NEUTROABS  --  2.0  HGB 15.5 13.8  HCT 44.2 39.6  MCV 86.7 86.5  PLT 148* 92*   Basic Metabolic Panel Recent Labs    12/17/19 1249  NA 136  K 3.5  CL 104  CO2 26  GLUCOSE 113*  BUN 18  CREATININE 0.99  CALCIUM 9.0   Liver Function Tests Recent Labs    12/15/19 0314  AST 72*  ALT 37  ALKPHOS 47  BILITOT 0.9  PROT 5.8*  ALBUMIN 3.3*   No results for input(s): LIPASE, AMYLASE in the last 72 hours. High Sensitivity Troponin:   Recent Labs  Lab 12/13/19 1249 12/13/19 1436 12/13/19 1707 12/14/19 0936  TROPONINIHS 5,108* 7,153* 7,861* 3,713*    BNP Invalid input(s): POCBNP D-Dimer No results for input(s): DDIMER in the last 72 hours. Hemoglobin A1C No results for input(s): HGBA1C in the last 72 hours. Fasting Lipid Panel No results for input(s): CHOL, HDL, LDLCALC, TRIG, CHOLHDL, LDLDIRECT in the last 72 hours. Thyroid Function Tests Recent Labs    12/15/19 0314  TSH 1.162   _____________  DG Chest 2 View  Result Date: 12/13/2019 CLINICAL DATA:  Fever, history of negative COVID test. EXAM: CHEST - 2 VIEW COMPARISON:  None FINDINGS: Cardiomediastinal contours and hilar structures are normal. Lungs are clear. No pleural effusion. Visualized skeletal structures are unremarkable. IMPRESSION: No acute cardiopulmonary disease. Electronically Signed   By: 12/15/2019 M.D.   On: 12/13/2019 14:01   MR CARDIAC MORPHOLOGY W WO CONTRAST  Result Date: 12/15/2019 CLINICAL DATA:  Myocarditis EXAM: CARDIAC MRI TECHNIQUE: The patient was scanned on a 1.5 Tesla GE magnet. A dedicated cardiac coil was used. Functional imaging was done using Fiesta sequences. 2,3, and 4 chamber views were done to assess for RWMA's. Modified Simpson's rule using a short axis stack was used to calculate an  ejection fraction on a dedicated work 12/17/2019. The patient received Gadavist After 10 minutes inversion recovery sequences were used to assess for infiltration and scar tissue. CONTRAST:  Research officer, [employer] FINDINGS: All 4 cardiac chamber sizes are normal. No ASD/PFO. Normal aortic root 2.7 cm. Small but nearly circumferential pericardial effusion most prominent posterior and lateral to the LV Normal MV, TV, AV the latter is tri leaflet. There is normal RV size and function The LV is normal size and thickness There are no defined RWMAls. There is mild diffuse hypokinesis The quantitative EF is 50% (EDV 145 cc ESV 73 cc SV 72 cc) Delayed enhancement images showed patchy uptake throughout the pericardium There was also uptake in the basal anterior and inferior myocardium as will as the anterolateral myocardium and apex IMPRESSION: 1. Findings consistent with myopericarditis with uptake of gadolinium in myocardium that does not correspond to coronary distribution 2.  Small pericardial effusion 3.  Quantitative EF 50% with mild diffuse hypokinesis 4.  Normal RV size and function 5.  Normal cardiac valves [id number] Electronically Signed   By: Charlton Haws.D.  On: 12/15/2019 08:47   ECHOCARDIOGRAM COMPLETE  Result Date: 12/13/2019    ECHOCARDIOGRAM REPORT   Patient Name:   Steven Sanchez Date of Exam: 12/13/2019 Medical Rec #:  [id number]       Height:       67.0 in Accession #:    [id number]      Weight:       150.0 lb Date of Birth:  23-Sep-1985       BSA:          1.790 m Patient Age:    33 years        BP:           114/81 mmHg Patient Gender: M               HR:           93 bpm. Exam Location:  [facility] Procedure: 2D Echo Indications:     Elevated Troponin  History:         Patient has no prior history of Echocardiogram examinations.  Sonographer:     Wonda Cerise RDCS Referring Phys:  [id number] Stade Diagnosing Phys: Charlton Haws MD IMPRESSIONS  1. Mild diffuse hypokinesis no defined RWMA.  Left ventricular ejection fraction, by estimation, is 45 to 50%. The left ventricle has mildly decreased function. The left ventricle demonstrates global hypokinesis. Left ventricular diastolic parameters were normal.  2. Right ventricular systolic function is normal. The right ventricular size is normal.  3. The mitral valve is normal in structure. No evidence of mitral valve regurgitation.  4. The aortic valve is tricuspid. Aortic valve regurgitation is not visualized. No aortic stenosis is present. FINDINGS  Left Ventricle: Mild diffuse hypokinesis no defined RWMA. Left ventricular ejection fraction, by estimation, is 45 to 50%. The left ventricle has mildly decreased function. The left ventricle demonstrates global hypokinesis. The left ventricular internal cavity size was normal in size. There is no left ventricular hypertrophy. Left ventricular diastolic parameters were normal. Right Ventricle: The right ventricular size is normal. No increase in right ventricular wall thickness. Right ventricular systolic function is normal. Left Atrium: Left atrial size was normal in size. Right Atrium: Right atrial size was normal in size. Pericardium: There is no evidence of pericardial effusion. Mitral Valve: The mitral valve is normal in structure. No evidence of mitral valve regurgitation. Tricuspid Valve: The tricuspid valve is normal in structure. Tricuspid valve regurgitation is mild. Aortic Valve: The aortic valve is tricuspid. Aortic valve regurgitation is not visualized. No aortic stenosis is present. Aortic valve peak gradient measures 5.5 mmHg. Pulmonic Valve: The pulmonic valve was normal in structure. Pulmonic valve regurgitation is not visualized. Aorta: The aortic root is normal in size and structure. IAS/Shunts: No atrial level shunt detected by color flow Doppler.  LEFT VENTRICLE PLAX 2D LVIDd:         5.45 cm      Diastology LVIDs:         4.03 cm      LV e' lateral:   10.10 cm/s LV PW:         1.07 cm       LV E/e' lateral: 6.5 LV IVS:        1.08 cm      LV e' medial:    10.60 cm/s LVOT diam:     2.10 cm      LV E/e' medial:  6.2 LV SV:         61 LV  SV Index:   34 LVOT Area:     3.46 cm  LV Volumes (MOD) LV vol d, MOD A2C: 87.3 ml LV vol d, MOD A4C: 101.0 ml LV vol s, MOD A2C: 52.7 ml LV vol s, MOD A4C: 50.1 ml LV SV MOD A2C:     34.6 ml LV SV MOD A4C:     101.0 ml LV SV MOD BP:      42.2 ml RIGHT VENTRICLE RV Basal diam:  2.98 cm RV S prime:     13.90 cm/s TAPSE (M-mode): 2.4 cm LEFT ATRIUM             Index       RIGHT ATRIUM          Index LA diam:        3.00 cm 1.68 cm/m  RA Area:     9.38 cm LA Vol (A2C):   29.9 ml 16.71 ml/m RA Volume:   21.40 ml 11.96 ml/m LA Vol (A4C):   16.4 ml 9.16 ml/m LA Biplane Vol: 23.9 ml 13.36 ml/m  AORTIC VALVE AV Area (Vmax): 2.87 cm AV Vmax:        117.00 cm/s AV Peak Grad:   5.5 mmHg LVOT Vmax:      96.80 cm/s LVOT Vmean:     61.400 cm/s LVOT VTI:       0.175 m  AORTA Ao Root diam: 3.30 cm Ao Asc diam:  3.10 cm MITRAL VALVE MV Area (PHT): 4.80 cm    SHUNTS MV Decel Time: 158 msec    Systemic VTI:  0.18 m MV E velocity: 66.00 cm/s  Systemic Diam: 2.10 cm MV A velocity: 53.40 cm/s MV E/A ratio:  1.24 Jenkins Rouge MD Electronically signed by Jenkins Rouge MD Signature Date/Time: 12/13/2019/5:45:32 PM    Final    Disposition   Pt is being discharged home today in good condition.  Follow-up Plans & Appointments    Follow-up Information    Wellington Hampshire, MD Follow up.   Specialty: Cardiology Why: The office will call to schdule an appointment Contact information: [address] [place] [address] [phone]        [facility] Follow up on 12/29/2019.   Specialty: Cardiology Why: Please get labs at [facility] 12/29/19 Contact information: [address] [place] [phone]         Discharge Instructions    Diet - low sodium heart healthy   Complete by:  As directed    Discharge instructions   Complete by: As directed    You may start back at work when cleared by cardiologist. Avoid strenuous activity.   Increase activity slowly   Complete by: As directed       Discharge Medications   Allergies as of 12/15/2019   No Known Allergies     Medication List    STOP taking these medications   amoxicillin-clavulanate 875-125 MG tablet Commonly known as: AUGMENTIN     TAKE these medications   acetaminophen 325 MG tablet Commonly known as: TYLENOL Take 650 mg by mouth every 6 (six) hours as needed.   Colchicine 0.6 MG Caps Commonly known as: Mitigare Take 0.6 mg by mouth 2 (two) times daily. Take this twice daily for 3 months   indomethacin 50 MG capsule Commonly known as: INDOCIN Take 1 capsule (50 mg total) by mouth 3 (three) times daily with meals for 3 days.  metoprolol tartrate 25 MG tablet Commonly known as: LOPRESSOR Take 0.5 tablets (12.5 mg total) by mouth 2 (two) times daily.   pantoprazole 40 MG tablet Commonly known as: PROTONIX Take 1 tablet (40 mg total) by mouth daily. Start taking on: December 16, 2019          Outstanding Labs/Studies   CRP and HS trop in 2 weeks 2D echo in 1 month  Duration of Discharge Encounter   Greater than 30 minutes including physician time.  Signed, Marcello Tuzzolino David StallH Elesia Pemberton, PA-C 12/15/2019, 1:50 PM

## 2019-12-15 NOTE — Telephone Encounter (Signed)
Orders for limited echo and lab work already in. Had to reenter labs for [facility]. Routing to scheduling and [employer].

## 2019-12-15 NOTE — Discharge Instructions (Signed)
Myocarditis, Adult  Myocarditis is inflammation of the heart muscle (myocardium). When heart muscle is inflamed, the heart cannot pump blood in an efficient way. As a result heart muscle may get damaged and swollen, and you may experience fast heart rhythms. Severe cases of myocarditis can cause heart failure. What are the causes? Common causes of this condition include:  Viral infections.  Bacterial infections.  Parasitic infections.  Fungal infections.  Radiation.  Reactions to medicines.  Exposure to certain chemicals or toxins.  Autoimmune diseases.  Inflammatory conditions.  Connective tissue diseases. What are the signs or symptoms? Symptoms of this condition include:  Chest pain.  Shortness of breath.  Fast or abnormal heart rhythms.  Fatigue.  Swelling in the feet or legs.  Fever.  Body aches.  Fainting. Mild cases of this condition may not cause symptoms. How is this diagnosed? This condition may be hard to diagnose because it can look like other conditions. It may be diagnosed based on:  Your symptoms. The condition may be suspected if your symptoms appear after a recent infection.  A physical exam.  Tests, such as: ? Blood tests to check for signs of infection or heart damage. ? An electrocardiogram to show your heart's electrical patterns and rhythms. ? A chest X-ray to look at your heart and lungs. ? An echocardiogram or other imaging tests to look at how well your heart is working. ? A test called cardiac catheterization to check for inflammation or infection. How is this treated? Treatment for this condition depends on the cause. It may include taking medicines, such as:  Heart medicines like beta blockers or angiotensin-converting enzyme (ACE) inhibitors. These medicines help strengthen the heart and help it beat more regularly.  Diuretic medicine. This medicine can help get rid of extra fluid in the body. Extra fluid can make the heart  work harder.  Steroid medicine. This medicine reduces swelling.  Medicine for pain.  Anti-inflammatory medicine. This medicine reduces inflammation in the heart.  Antibiotic medicines. These may be prescribed if a bacterial infection caused the condition. Follow these instructions at home: Medicines  Take over-the-counter and prescription medicines only as told by your health care provider.  If you were prescribed an antibiotic medicine, take it as told by your health care provider. Do not stop taking the antibiotic even if you start to feel better. General instructions  Do not use any products that contain nicotine or tobacco, such as cigarettes and e-cigarettes. If you need help quitting, ask your health care provider.  Avoid or limit alcohol.  Avoid people who are sick.  Wash your hands with soap and water regularly to avoid getting an infection. If soap and water are not available, use hand sanitizer.  Return to your normal activities as told by your health care provider. Ask your health care provider what activities are safe for you.  Keep all follow-up visits as told by your health care provider. This is important. Contact a health care provider if:  You have a fever.  Your symptoms continue to get worse, and medicine does not help  You have swelling in your legs, feet, or face.  Your breathing is faster than normal.  You have constipation or diarrhea. Get help right away if:  You have severe chest pain.  You have shortness of breath.  You have problems breathing.  You become more pale than normal.  Your hands or legs are cold and blue.  You pass out or faint.  You have   fast or abnormal heart rhythms.  You have nausea, vomiting, and abdominal pain. These symptoms may represent a serious problem that is an emergency. Do not wait to see if the symptoms will go away. Get medical help right away. Call your local emergency services (911 in the [place] Do not  drive yourself to the hospital. Summary  Myocarditis is inflammation of the heart muscle (myocardium).  Mild cases of myocarditis may not cause symptoms.  Myocarditis may be hard to diagnose because it can look like other conditions.  Treatment for myocarditis includes treating the underlying cause of the disease. This information is not intended to replace advice given to you by your health care provider. Make sure you discuss any questions you have with your health care provider. Document Revised: 07/20/2017 Document Reviewed: 09/14/2016 Elsevier Patient Education  2020 [employer]  

## 2019-12-15 NOTE — Telephone Encounter (Signed)
Furth, Cadence H, PA-C  P Cv Div [facility] Toc  This patient is being discharged today from [facility] for Myocarditis and needs TOC follow-up with Dr. Kirke Corin in 2-3 weeks. Please call and arrange and [facility] phone call.   Thanks!

## 2019-12-15 NOTE — Progress Notes (Addendum)
Progress Note  Patient Name: Steven Sanchez Date of Encounter: 12/15/2019  Primary Cardiologist: Lorine Bears, MD   Subjective   Patient is feeling better today. Afebrile this morning. No chest pain.   Inpatient Medications    Scheduled Meds: . colchicine  0.6 mg Oral BID  . enoxaparin (LOVENOX) injection  40 mg Subcutaneous Q24H  . indomethacin  50 mg Oral TID WC  . metoprolol tartrate  12.5 mg Oral BID  . pantoprazole  40 mg Oral Daily  . sodium chloride flush  3 mL Intravenous Q12H   Continuous Infusions: . sodium chloride     PRN Meds: sodium chloride, acetaminophen, ondansetron (ZOFRAN) IV, sodium chloride flush   Vital Signs    Vitals:   12/14/19 1807 12/14/19 2001 12/15/19 0016 12/15/19 0321  BP:  102/63 (!) 95/59 116/75  Pulse: 71 73 64 60  Resp:  18 16 (!) 21  Temp:  97.8 F (36.6 C) 97.6 F (36.4 C) (!) 97.5 F (36.4 C)  TempSrc:  Oral Oral Oral  SpO2:  99% 100% 100%  Weight:    63.2 kg  Height:       No intake or output data in the 24 hours ending 12/15/19 0851 Last 3 Weights 12/15/2019 12/14/2019 12/13/2019  Weight (lbs) 139 lb 6.4 oz 145 lb 8 oz 150 lb  Weight (kg) 63.231 kg 65.998 kg 68.04 kg      Telemetry    NSR HR 50-60 - Personally Reviewed  ECG    NSr, 71bpm, TWI  I, aVL, V6- Personally Reviewed  Physical Exam   GEN: No acute distress.   Neck: No JVD Cardiac: RRR, no murmurs, rubs, or gallops.  Respiratory: Clear to auscultation bilaterally. GI: Soft, nontender, non-distended  MS: No edema; No deformity. Neuro:  Nonfocal  Psych: Normal affect   Labs    High Sensitivity Troponin:   Recent Labs  Lab 12/13/19 1249 12/13/19 1436 12/13/19 1707 12/14/19 0936  TROPONINIHS 5,108* 7,153* 7,861* 3,713*      Chemistry Recent Labs  Lab 12/13/19 1249 12/15/19 0314  NA 136  --   K 3.5  --   CL 104  --   CO2 26  --   GLUCOSE 113*  --   BUN 18  --   CREATININE 0.99  --   CALCIUM 9.0  --   PROT  --  5.8*  ALBUMIN  --   3.3*  AST  --  72*  ALT  --  37  ALKPHOS  --  47  BILITOT  --  0.9  GFRNONAA >60  --   GFRAA >60  --   ANIONGAP 6  --      Hematology Recent Labs  Lab 12/13/19 1249 12/15/19 0314  WBC 7.8 3.1*  RBC 5.10 4.58  HGB 15.5 13.8  HCT 44.2 39.6  MCV 86.7 86.5  MCH 30.4 30.1  MCHC 35.1 34.8  RDW 11.9 12.1  PLT 148* 92*    BNPNo results for input(s): BNP, PROBNP in the last 168 hours.   DDimer No results for input(s): DDIMER in the last 168 hours.   Radiology    DG Chest 2 View  Result Date: 12/13/2019 CLINICAL DATA:  Fever, history of negative COVID test. EXAM: CHEST - 2 VIEW COMPARISON:  None FINDINGS: Cardiomediastinal contours and hilar structures are normal. Lungs are clear. No pleural effusion. Visualized skeletal structures are unremarkable. IMPRESSION: No acute cardiopulmonary disease. Electronically Signed   By: Jason Fila.D.  On: 12/13/2019 14:01   MR CARDIAC MORPHOLOGY W WO CONTRAST  Result Date: 12/15/2019 CLINICAL DATA:  Myocarditis EXAM: CARDIAC MRI TECHNIQUE: The patient was scanned on a 1.5 Tesla GE magnet. A dedicated cardiac coil was used. Functional imaging was done using Fiesta sequences. 2,3, and 4 chamber views were done to assess for RWMA's. Modified Simpson's rule using a short axis stack was used to calculate an ejection fraction on a dedicated work Research officer, trade [employer]. The patient received Gadavist After 10 minutes inversion recovery sequences were used to assess for infiltration and scar tissue. CONTRAST:  OEUMPNTI FINDINGS: All 4 cardiac chamber sizes are normal. No ASD/PFO. Normal aortic root 2.7 cm. Small but nearly circumferential pericardial effusion most prominent posterior and lateral to the LV Normal MV, TV, AV the latter is tri leaflet. There is normal RV size and function The LV is normal size and thickness There are no defined RWMAls. There is mild diffuse hypokinesis The quantitative EF is 50% (EDV 145 cc ESV 73 cc SV 72 cc) Delayed  enhancement images showed patchy uptake throughout the pericardium There was also uptake in the basal anterior and inferior myocardium as will as the anterolateral myocardium and apex IMPRESSION: 1. Findings consistent with myopericarditis with uptake of gadolinium in myocardium that does not correspond to coronary distribution 2.  Small pericardial effusion 3.  Quantitative EF 50% with mild diffuse hypokinesis 4.  Normal RV size and function 5.  Normal cardiac valves Charlton Haws Electronically Signed   By: Charlton Haws M.D.   On: 12/15/2019 08:47   ECHOCARDIOGRAM COMPLETE  Result Date: 12/13/2019    ECHOCARDIOGRAM REPORT   Patient Name:   Steven Sanchez Date of Exam: 12/13/2019 Medical Rec #:  [id number]       Height:       67.0 in Accession #:    [id number]      Weight:       150.0 lb Date of Birth:  05/01/1986       BSA:          1.790 m Patient Age:    33 years        BP:           114/81 mmHg Patient Gender: M               HR:           93 bpm. Exam Location:  [facility] Procedure: 2D Echo Indications:     Elevated Troponin  History:         Patient has no prior history of Echocardiogram examinations.  Sonographer:     Wonda Cerise RDCS Referring Phys:  [id number] Wendall Stade Diagnosing Phys: Charlton Haws MD IMPRESSIONS  1. Mild diffuse hypokinesis no defined RWMA. Left ventricular ejection fraction, by estimation, is 45 to 50%. The left ventricle has mildly decreased function. The left ventricle demonstrates global hypokinesis. Left ventricular diastolic parameters were normal.  2. Right ventricular systolic function is normal. The right ventricular size is normal.  3. The mitral valve is normal in structure. No evidence of mitral valve regurgitation.  4. The aortic valve is tricuspid. Aortic valve regurgitation is not visualized. No aortic stenosis is present. FINDINGS  Left Ventricle: Mild diffuse hypokinesis no defined RWMA. Left ventricular ejection fraction, by estimation, is 45 to 50%. The left ventricle  has mildly decreased function. The left ventricle demonstrates global hypokinesis. The left ventricular internal cavity size was normal in size. There is no left  ventricular hypertrophy. Left ventricular diastolic parameters were normal. Right Ventricle: The right ventricular size is normal. No increase in right ventricular wall thickness. Right ventricular systolic function is normal. Left Atrium: Left atrial size was normal in size. Right Atrium: Right atrial size was normal in size. Pericardium: There is no evidence of pericardial effusion. Mitral Valve: The mitral valve is normal in structure. No evidence of mitral valve regurgitation. Tricuspid Valve: The tricuspid valve is normal in structure. Tricuspid valve regurgitation is mild. Aortic Valve: The aortic valve is tricuspid. Aortic valve regurgitation is not visualized. No aortic stenosis is present. Aortic valve peak gradient measures 5.5 mmHg. Pulmonic Valve: The pulmonic valve was normal in structure. Pulmonic valve regurgitation is not visualized. Aorta: The aortic root is normal in size and structure. IAS/Shunts: No atrial level shunt detected by color flow Doppler.  LEFT VENTRICLE PLAX 2D LVIDd:         5.45 cm      Diastology LVIDs:         4.03 cm      LV e' lateral:   10.10 cm/s LV PW:         1.07 cm      LV E/e' lateral: 6.5 LV IVS:        1.08 cm      LV e' medial:    10.60 cm/s LVOT diam:     2.10 cm      LV E/e' medial:  6.2 LV SV:         61 LV SV Index:   34 LVOT Area:     3.46 cm  LV Volumes (MOD) LV vol d, MOD A2C: 87.3 ml LV vol d, MOD A4C: 101.0 ml LV vol s, MOD A2C: 52.7 ml LV vol s, MOD A4C: 50.1 ml LV SV MOD A2C:     34.6 ml LV SV MOD A4C:     101.0 ml LV SV MOD BP:      42.2 ml RIGHT VENTRICLE RV Basal diam:  2.98 cm RV S prime:     13.90 cm/s TAPSE (M-mode): 2.4 cm LEFT ATRIUM             Index       RIGHT ATRIUM          Index LA diam:        3.00 cm 1.68 cm/m  RA Area:     9.38 cm LA Vol (A2C):   29.9 ml 16.71 ml/m RA Volume:    21.40 ml 11.96 ml/m LA Vol (A4C):   16.4 ml 9.16 ml/m LA Biplane Vol: 23.9 ml 13.36 ml/m  AORTIC VALVE AV Area (Vmax): 2.87 cm AV Vmax:        117.00 cm/s AV Peak Grad:   5.5 mmHg LVOT Vmax:      96.80 cm/s LVOT Vmean:     61.400 cm/s LVOT VTI:       0.175 m  AORTA Ao Root diam: 3.30 cm Ao Asc diam:  3.10 cm MITRAL VALVE MV Area (PHT): 4.80 cm    SHUNTS MV Decel Time: 158 msec    Systemic VTI:  0.18 m MV E velocity: 66.00 cm/s  Systemic Diam: 2.10 cm MV A velocity: 53.40 cm/s MV E/A ratio:  1.24 Charlton Haws MD Electronically signed by Charlton Haws MD Signature Date/Time: 12/13/2019/5:45:32 PM    Final     Cardiac Studies   Echo 12/13/19 1. Mild diffuse hypokinesis no defined RWMA. Left ventricular ejection  fraction, by  estimation, is 45 to 50%. The left ventricle has mildly  decreased function. The left ventricle demonstrates global hypokinesis.  Left ventricular diastolic parameters  were normal.  2. Right ventricular systolic function is normal. The right ventricular  size is normal.  3. The mitral valve is normal in structure. No evidence of mitral valve  regurgitation.  4. The aortic valve is tricuspid. Aortic valve regurgitation is not  visualized. No aortic stenosis is present.   Cardiac MRI IMPRESSION: 1. Findings consistent with myopericarditis with uptake of gadolinium in myocardium that does not correspond to coronary distribution  2.  Small pericardial effusion  3.  Quantitative EF 50% with mild diffuse hypokinesis  4.  Normal RV size and function  5.  Normal cardiac valves  Patient Profile     34 y.o. male with no previous cardiac history who presented to the ED 10/15/19 for chest pain presumed to have myocarditis.   Assessment & Plan    Myocarditis Was seen in [place] clinic on Saturday with fever, chills, myalgias and EKG showed J point elevation. HS trop peaked at 7861. TTE showed no RWMA, EF 50-55%. Started on indocin and colchicine. Sent to [facility] ED  for evaluation.  - Cardaic MRI showed myopericarditis - Echo showed 50-55% - WBC wnl - Sed rate 6 - CRP 9.9 - Metoprolol 12.5mg  BID started - Troponin downtrending   For questions or updates, please contact [place] Please consult [email or web address] for contact info under        Signed, Cadence Ninfa Meeker, PA-C  12/15/2019, 8:51 AM    I have seen and examined the patient along with Cadence Ninfa Meeker, PA-C .  I have reviewed the chart, notes and new data.  I agree with PA/NP's note.  Key new complaints: feels much better, no further pleuritic pain, no dyspnea Key examination changes: no rub, normal rhythm Key new findings / data: MRI results reviewed. EF 50% and gadolinium hyper-enhancement in a pattern c/w acute myocarditis.  PLAN: Recommend indomethacin only for another 3 days, followed by colchicine for 3 months. Recheck troponin and CRP just before TOC visit with Dr. Fletcher Anon in 3 weeks and repeat 2D limited echo for EF after about a month. OK for light-moderate activity, avoid strenuous or lengthy physical activity.  Sanda Klein, MD, [place] [phone] 12/15/2019, 9:53 AM

## 2019-12-16 NOTE — Telephone Encounter (Signed)
LVM to call and schedule ECHO

## 2019-12-19 ENCOUNTER — Telehealth: Payer: Self-pay | Admitting: *Deleted

## 2019-12-19 ENCOUNTER — Other Ambulatory Visit
Admission: RE | Admit: 2019-12-19 | Discharge: 2019-12-19 | Disposition: A | Discharge status: Home / Self Care | Payer: BC Managed Care – PPO | Source: Ambulatory Visit | Attending: Cardiovascular Disease | Admitting: Cardiovascular Disease

## 2019-12-19 DIAGNOSIS — R3989 Other symptoms and signs involving the genitourinary system: Secondary | ICD-10-CM

## 2019-12-19 DIAGNOSIS — I514 Myocarditis, unspecified: Secondary | ICD-10-CM

## 2019-12-19 DIAGNOSIS — R519 Headache, unspecified: Secondary | ICD-10-CM

## 2019-12-19 LAB — CBC WITH DIFFERENTIAL/PLATELET
Abs Immature Granulocytes: 0.03 10*3/uL (ref 0.00–0.07)
Basophils Absolute: 0.1 10*3/uL (ref 0.0–0.1)
Basophils Relative: 1 %
Eosinophils Absolute: 0 10*3/uL (ref 0.0–0.5)
Eosinophils Relative: 0 %
HCT: 35.9 % — ABNORMAL LOW (ref 39.0–52.0)
Hemoglobin: 12.5 g/dL — ABNORMAL LOW (ref 13.0–17.0)
Immature Granulocytes: 1 %
Lymphocytes Relative: 38 %
Lymphs Abs: 1.4 10*3/uL (ref 0.7–4.0)
MCH: 30 pg (ref 26.0–34.0)
MCHC: 34.8 g/dL (ref 30.0–36.0)
MCV: 86.3 fL (ref 80.0–100.0)
Monocytes Absolute: 0.3 10*3/uL (ref 0.1–1.0)
Monocytes Relative: 7 %
Neutro Abs: 2.1 10*3/uL (ref 1.7–7.7)
Neutrophils Relative %: 53 %
Platelets: 111 10*3/uL — ABNORMAL LOW (ref 150–400)
RBC: 4.16 MIL/uL — ABNORMAL LOW (ref 4.22–5.81)
RDW: 12.7 % (ref 11.5–15.5)
Smear Review: DECREASED
WBC: 3.8 10*3/uL — ABNORMAL LOW (ref 4.0–10.5)
nRBC: 0 % (ref 0.0–0.2)

## 2019-12-19 LAB — BASIC METABOLIC PANEL
Anion gap: 11 (ref 5–15)
BUN: 12 mg/dL (ref 6–20)
CO2: 26 mmol/L (ref 22–32)
Calcium: 8.5 mg/dL — ABNORMAL LOW (ref 8.9–10.3)
Chloride: 101 mmol/L (ref 98–111)
Creatinine, Ser: 0.92 mg/dL (ref 0.61–1.24)
GFR calc Af Amer: >60 mL/min (ref >60)
GFR calc non Af Amer: >60 mL/min (ref >60)
Glucose, Bld: 124 mg/dL — ABNORMAL HIGH (ref 70–99)
Potassium: 3.2 mmol/L — ABNORMAL LOW (ref 3.5–5.1)
Sodium: 138 mmol/L (ref 135–145)

## 2019-12-19 LAB — TROPONIN I (HIGH SENSITIVITY): Troponin I (High Sensitivity): 31 ng/L — ABNORMAL HIGH (ref <18)

## 2019-12-19 LAB — CK TOTAL AND CKMB (NOT AT ARMC)
CK, MB: 0.9 ng/mL (ref 0.5–5.0)
Relative Index: INVALID (ref 0.0–2.5)
Total CK: 48 U/L — ABNORMAL LOW (ref 49–397)

## 2019-12-19 LAB — C-REACTIVE PROTEIN: CRP: 8 mg/dL — ABNORMAL HIGH (ref <1.0)

## 2019-12-19 NOTE — Telephone Encounter (Signed)
I spoke with the patient. He is aware his lab orders have been placed and that Dr. Kirke Corin would like him to have these today. I have advised him to come today to the [facility] at [facility], 1st desk on the right to check in.  The patient voices understanding and is agreeable.

## 2019-12-19 NOTE — Telephone Encounter (Signed)
Attempted to contact the patient. °No answer- I left a message to please call back.  °

## 2019-12-19 NOTE — Telephone Encounter (Signed)
Secure chat sent to Dr. Kirke Corin asking if the patient should go ahead and get the HS troponin & CRP that was ordered earlier this week (see 12/15/19 phone note) in 2 weeks.   Per Dr. Kirke Corin, ok to go ahead and get these labs as well.   Orders placed for a CBC w/ diff/ BMP/ CK-total/ CRP/ HS Troponin.   I attempted to call the patient to notify him his orders are placed and where to go. No answer- I left a message to please call back and ask for me.

## 2019-12-19 NOTE — Telephone Encounter (Signed)
-----   Message from [place] Ouch, MD sent at 12/19/2019 11:18 AM EDT ----- This patient is going to establish with [place] in the [place] office.  I know him personally and he called me due to continuous headache and dark discoloration of the urine.  Please order stat labs on him to be done today including CBC, CPK and basic metabolic profile.  Please confirm getting this message.

## 2019-12-22 NOTE — Telephone Encounter (Addendum)
Spoke with the patient. Patient sts that he feels 100% better than he did on Friday. He does still have a slight headache. His color is better, he is not as pale and his energy level has improved.  Appt scheduled with Dr. Kirke Corin on 01/06/20 @ 4:40pm. Patient is aware of the appt date, time, and location.  Patient voiced appreciation for the call.

## 2019-12-22 NOTE — Telephone Encounter (Signed)
Ok thanks 

## 2019-12-22 NOTE — Telephone Encounter (Signed)
-----   Message from [place] Ouch, MD sent at 12/21/2019 11:49 AM EDT ----- I called the patient on Friday and discussed results with him.  He seems to be having bone marrow suppression which is likely due to colchicine.  I instructed him to stop taking colchicine.  There was a concern about possible rhabdomyolysis but his CPK came back normal which is reassuring. Please call him on Monday to see how he is doing.  He needs to have a follow-up appointment with me.

## 2020-01-06 ENCOUNTER — Ambulatory Visit (INDEPENDENT_AMBULATORY_CARE_PROVIDER_SITE_OTHER): Payer: BC Managed Care – PPO | Admitting: Cardiovascular Disease

## 2020-01-06 ENCOUNTER — Other Ambulatory Visit: Payer: Self-pay

## 2020-01-06 ENCOUNTER — Encounter: Payer: Self-pay | Admitting: Cardiovascular Disease

## 2020-01-06 VITALS — BP 112/64 | HR 68 | Ht 67.0 in | Wt 150.0 lb

## 2020-01-06 DIAGNOSIS — I429 Cardiomyopathy, unspecified: Secondary | ICD-10-CM

## 2020-01-06 DIAGNOSIS — I514 Myocarditis, unspecified: Secondary | ICD-10-CM | POA: Diagnosis not present

## 2020-01-06 NOTE — Progress Notes (Signed)
Cardiology Office Note   Date:  01/06/2020   ID:  Steven Sanchez, DOB 05-30-1986, MRN [id number]  PCP:  Margaretann Loveless, PA-C  Cardiologist:   Lorine Bears, MD   Chief Complaint  Patient presents with  . other    2 week follow up from [facility] ER; chest pain. Meds reviewed by the pt. verbally. Pt. c/o chest discomfort when taking a deep breath.       History of Present Illness: Steven Sanchez is a 34 y.o. male who presents for a follow-up visit after recent hospitalization for myopericarditis.  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  He presented last month with chest pain, fever and myalgia with flulike symptoms in the preceding few days.  His temperature was 103 with body aches.  His chest pain was sharp and pleuritic.  He tested negative for Covid 19 and he has not had his Covid vaccine yet.  EKG showed minimal ST elevation in the lateral leads.  Troponin was elevated at 7000.  Echocardiogram showed an EF of 45 to 50% with global hypokinesis.  He was treated with indomethacin and colchicine.  Troponin trended down.  Cardiac MRI showed findings consistent with myopericarditis with small pericardial effusion and EF of 50% and normal valvular structures.  He was discharged home on small dose metoprolol in addition to colchicine, indomethacin and Protonix.  He called [place] few days after hospital discharge with increased headache and dark discoloration of the urine.  I was concerned about possible rhabdomyolysis.  He had labs done which showed normal CPK and renal function.  His potassium was low at 3.2.  CRP was still elevated at 8.  CBC showed leukopenia with a white cell count of 3.8 with mild anemia 12.5 and thrombocytopenia at 111,000.  I thought he was having some bone marrow suppression related to treatment with colchicine which was discontinued. He has been gradually improving every day.  He still has  occasional pleuritic chest pain.  No fever or chills.  He has not required to take any acetaminophen in the last few days and stopped ibuprofen more than a week ago.    History reviewed. No pertinent past medical history.  History reviewed. No pertinent surgical history.   Current Outpatient Medications  Medication Sig Dispense Refill  . acetaminophen (TYLENOL) 325 MG tablet Take 650 mg by mouth every 6 (six) hours as needed.    . metoprolol tartrate (LOPRESSOR) 25 MG tablet Take 0.5 tablets (12.5 mg total) by mouth 2 (two) times daily. (Patient taking differently: Take 25 mg by mouth 2 (two) times daily. ) 60 tablet 6  . pantoprazole (PROTONIX) 40 MG tablet Take 1 tablet (40 mg total) by mouth daily. 90 tablet 1   No current facility-administered medications for this visit.    Allergies:   Patient has no known allergies.    Social History:  The patient  reports that he has been smoking. He has never used smokeless tobacco. He reports current alcohol use of about 5.0 standard drinks of alcohol per week. He reports that he does not use drugs.   Family History:  The patient's family history includes Hypertension in his father.    ROS:  Please see the history of present illness.   Otherwise, review of systems are positive for none.   All other systems are reviewed and negative.    PHYSICAL EXAM: VS:  BP 112/64 (BP Location: Left  Arm, Patient Position: Sitting, Cuff Size: Normal)   Pulse 68   Ht 5\' 7"  (1.702 m)   Wt 150 lb (68 kg)   SpO2 99%   BMI 23.49 kg/m  , BMI Body mass index is 23.49 kg/m. GEN: Well nourished, well developed, in no acute distress  HEENT: normal  Neck: no JVD, carotid bruits, or masses Cardiac: RRR; no murmurs, rubs, or gallops,no edema  Respiratory:  clear to auscultation bilaterally, normal work of breathing GI: soft, nontender, nondistended, + BS MS: no deformity or atrophy  Skin: warm and dry, no rash Neuro:  Strength and sensation are  intact Psych: euthymic mood, full affect   EKG:  EKG is ordered today. The ekg ordered today demonstrates normal sinus rhythm with no significant ST or T wave changes.   Recent Labs: 12/15/2019: ALT 37; TSH 1.162 12/19/2019: BUN 12; Creatinine, Ser 0.92; Hemoglobin 12.5; Platelets 111; Potassium 3.2; Sodium 138    Lipid Panel No results found for: CHOL, TRIG, HDL, CHOLHDL, VLDL, LDLCALC, LDLDIRECT    Wt Readings from Last 3 Encounters:  01/06/20 150 lb (68 kg)  12/15/19 139 lb 6.4 oz (63.2 kg)  12/13/19 150 lb (68 kg)        No flowsheet data found.    ASSESSMENT AND PLAN:  1.  Myopericarditis: In the setting of febrile likely viral illness based on his presentation.  His symptoms have gradually improved.  Colchicine was discontinued due to concerns about possible bone marrow suppression.  HIV test was negative. 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 The plan is to repeat CBC and C-reactive protein in 2 weeks. I discontinued Protonix.  2.  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  He is scheduled for repeat echocardiogram early next month.  Until then, continue metoprolol 25 mg twice daily.    Disposition:   FU with me in 3 months  Signed,  Kathlyn Sacramento, MD  01/06/2020 4:48 PM    [place]

## 2020-01-06 NOTE — Patient Instructions (Addendum)
Medication Instructions:  Your physician recommends that you continue on your current medications as directed. Please refer to the Current Medication list given to you today.  *If you need a refill on your cardiac medications before your next appointment, please call your pharmacy*   Lab Work: Your physician recommends that you return for lab work same day as schedule echo.   If you have labs (blood work) drawn today and your tests are completely normal, you will receive your results only by: Marland Kitchen MyChart Message (if you have MyChart) OR . A paper copy in the mail If you have any lab test that is abnormal or we need to change your treatment, we will call you to review the results.   Testing/Procedures: None ordered   Follow-Up: At [facility], you and your health needs are our priority.  As part of our continuing mission to provide you with exceptional heart care, we have created designated Provider Care Teams.  These Care Teams include your primary Cardiologist (physician) and Advanced Practice Providers (APPs -  Physician Assistants and Nurse Practitioners) who all work together to provide you with the care you need, when you need it.  We recommend signing up for the patient portal called "MyChart".  Sign up information is provided on this After Visit Summary.  MyChart is used to connect with patients for Virtual Visits (Telemedicine).  Patients are able to view lab/test results, encounter notes, upcoming appointments, etc.  Non-urgent messages can be sent to your provider as well.   To learn more about what you can do with MyChart, go to [email or web address].    Your next appointment:   3 months  The format for your next appointment:   In Person  Provider:    You may see Lorine Bears, MD or one of the following Advanced Practice Providers on your designated Care Team:    Nicolasa Ducking, NP  Eula Listen, PA-C  Marisue Ivan, PA-C    Other Instructions N/A

## 2020-01-07 ENCOUNTER — Telehealth: Payer: Self-pay | Admitting: Cardiovascular Disease

## 2020-01-07 NOTE — Telephone Encounter (Signed)
l mom to schedule 3 month f/u   

## 2020-01-20 ENCOUNTER — Other Ambulatory Visit: Payer: Self-pay | Admitting: Medical

## 2020-01-20 DIAGNOSIS — R079 Chest pain, unspecified: Secondary | ICD-10-CM

## 2020-01-20 DIAGNOSIS — I514 Myocarditis, unspecified: Secondary | ICD-10-CM

## 2020-01-21 ENCOUNTER — Ambulatory Visit (INDEPENDENT_AMBULATORY_CARE_PROVIDER_SITE_OTHER): Payer: BC Managed Care – PPO

## 2020-01-21 ENCOUNTER — Other Ambulatory Visit (INDEPENDENT_AMBULATORY_CARE_PROVIDER_SITE_OTHER): Payer: BC Managed Care – PPO | Admitting: *Deleted

## 2020-01-21 ENCOUNTER — Other Ambulatory Visit: Payer: Self-pay

## 2020-01-21 DIAGNOSIS — I514 Myocarditis, unspecified: Secondary | ICD-10-CM | POA: Diagnosis not present

## 2020-01-21 DIAGNOSIS — I429 Cardiomyopathy, unspecified: Secondary | ICD-10-CM

## 2020-01-21 DIAGNOSIS — R079 Chest pain, unspecified: Secondary | ICD-10-CM

## 2020-01-22 LAB — CBC WITH DIFFERENTIAL/PLATELET
Basophils Absolute: 0 10*3/uL (ref 0.0–0.2)
Basos: 1 %
EOS (ABSOLUTE): 0 10*3/uL (ref 0.0–0.4)
Eos: 0 %
Hematocrit: 40.5 % (ref 37.5–51.0)
Hemoglobin: 13.8 g/dL (ref 13.0–17.7)
Immature Grans (Abs): 0 10*3/uL (ref 0.0–0.1)
Immature Granulocytes: 0 %
Lymphocytes Absolute: 1.4 10*3/uL (ref 0.7–3.1)
Lymphs: 27 %
MCH: 29.9 pg (ref 26.6–33.0)
MCHC: 34.1 g/dL (ref 31.5–35.7)
MCV: 88 fL (ref 79–97)
Monocytes Absolute: 0.6 10*3/uL (ref 0.1–0.9)
Monocytes: 12 %
Neutrophils Absolute: 3.2 10*3/uL (ref 1.4–7.0)
Neutrophils: 60 %
Platelets: 208 10*3/uL (ref 150–450)
RBC: 4.62 x10E6/uL (ref 4.14–5.80)
RDW: 13.2 % (ref 11.6–15.4)
WBC: 5.3 10*3/uL (ref 3.4–10.8)

## 2020-01-22 LAB — C-REACTIVE PROTEIN: CRP: <1 mg/L (ref 0–10)

## 2020-01-26 ENCOUNTER — Telehealth: Payer: Self-pay

## 2020-01-26 NOTE — Telephone Encounter (Signed)
Patient made aware of results and Dr. Jari Sportsman recommendation with verbalized understanding.

## 2020-01-26 NOTE — Telephone Encounter (Signed)
-----   Message from [place] Ouch, MD sent at 01/23/2020  5:01 PM EDT ----- His labs are back to normal.  If he is feeling fine, he can resume all exercise with no restrictions.

## 2020-01-26 NOTE — Telephone Encounter (Signed)
Called to give the patient lab results and Dr. Arida's recommendation. Lmtcb. 

## 2020-02-20 ENCOUNTER — Other Ambulatory Visit: Payer: Self-pay

## 2020-02-20 ENCOUNTER — Ambulatory Visit: Payer: BC Managed Care – PPO | Admitting: Physician Assistant

## 2020-02-20 ENCOUNTER — Encounter: Payer: Self-pay | Admitting: Physician Assistant

## 2020-02-20 VITALS — BP 115/75 | HR 61 | Temp 97.1°F | Resp 16 | Ht 67.0 in | Wt 151.2 lb

## 2020-02-20 DIAGNOSIS — K219 Gastro-esophageal reflux disease without esophagitis: Secondary | ICD-10-CM

## 2020-02-20 MED ORDER — PANTOPRAZOLE SODIUM 40 MG PO TBEC: 40.0000 mg | DELAYED_RELEASE_TABLET | Freq: Every day | ORAL | 1 refills | Status: DC

## 2020-02-20 NOTE — Patient Instructions (Signed)

## 2020-02-20 NOTE — Progress Notes (Signed)
Established patient visit   Patient: Steven Sanchez   DOB: [birth year]   34 y.o. Male  MRN: [id number] Visit Date: 02/20/2020  Today's healthcare provider: Margaretann Loveless, PA-C   Chief Complaint  Patient presents with   Gastroesophageal Reflux   Subjective    HPI  Patient here to follow up has not been seen over a year. Reports that his Cardiologist stopped the Metoprolol. He was started on Metoprolol, Indomethacin and Colchicine for acute myocarditis and pericardial effusion associated with viral illness. He has been seeing Dr. Kirke Corin and has been cleared for all physical activity. He is to f/u with Dr. Kirke Corin in 03/2020.  GERD  The patient was last seen for GERD a month ago by Dr. Kirke Corin   Changes made since that visit include continue current medication.  He reports excellent compliance with treatment. He is not having side effects.  He IS experiencing fullness after meals. He is NOT experiencing abdominal bloating, belching, belching and eructation, chest pain, choking on food, cough or difficulty swallowing Medication helps all symptoms. If not taking symptoms return. -----------------------------------------------------------------------------------------   Patient Active Problem List   Diagnosis Date Noted   Cardiomyopathy (HCC) 12/15/2019   Fever 12/15/2019   Chest pain 12/15/2019   Myocarditis (HCC) 12/14/2019   History reviewed. No pertinent past medical history.     Medications: Outpatient Medications Prior to Visit  Medication Sig   pantoprazole (PROTONIX) 40 MG tablet Take 1 tablet (40 mg total) by mouth daily.   acetaminophen (TYLENOL) 325 MG tablet Take 650 mg by mouth every 6 (six) hours as needed.   metoprolol tartrate (LOPRESSOR) 25 MG tablet Take 0.5 tablets (12.5 mg total) by mouth 2 (two) times daily. (Patient not taking: Reported on 02/20/2020)   No facility-administered medications prior to visit.    Review of Systems    Constitutional: Negative.   Respiratory: Negative.   Cardiovascular: Negative.   Gastrointestinal: Negative.   Neurological: Negative.     Last CBC Lab Results  Component Value Date   WBC 5.3 01/21/2020   HGB 13.8 01/21/2020   HCT 40.5 01/21/2020   MCV 88 01/21/2020   MCH 29.9 01/21/2020   RDW 13.2 01/21/2020   PLT 208 01/21/2020   Last metabolic panel Lab Results  Component Value Date   GLUCOSE 124 (H) 12/19/2019   NA 138 12/19/2019   K 3.2 (L) 12/19/2019   CL 101 12/19/2019   CO2 26 12/19/2019   BUN 12 12/19/2019   CREATININE 0.92 12/19/2019   GFRNONAA >60 12/19/2019   GFRAA >60 12/19/2019   CALCIUM 8.5 (L) 12/19/2019   PROT 5.8 (L) 12/15/2019   ALBUMIN 3.3 (L) 12/15/2019   BILITOT 0.9 12/15/2019   ALKPHOS 47 12/15/2019   AST 72 (H) 12/15/2019   ALT 37 12/15/2019   ANIONGAP 11 12/19/2019   Last lipids No results found for: CHOL, HDL, LDLCALC, LDLDIRECT, TRIG, CHOLHDL    Objective    BP 115/75 (BP Location: Left Arm, Patient Position: Sitting, Cuff Size: Normal)    Pulse 61    Temp (!) 97.1 F (36.2 C) (Temporal)    Resp 16    Ht 5\' 7"  (1.702 m)    Wt 151 lb 3.2 oz (68.6 kg)    BMI 23.68 kg/m  BP Readings from Last 3 Encounters:  02/20/20 115/75  01/06/20 112/64  12/15/19 111/81   Wt Readings from Last 3 Encounters:  02/20/20 151 lb 3.2 oz (68.6 kg)  01/06/20 150 lb (  68 kg)  12/15/19 139 lb 6.4 oz (63.2 kg)      Physical Exam Vitals reviewed.  Constitutional:      General: He is not in acute distress.    Appearance: Normal appearance. He is well-developed. He is not ill-appearing.  HENT:     Head: Normocephalic and atraumatic.  Eyes:     Conjunctiva/sclera: Conjunctivae normal.  Pulmonary:     Effort: Pulmonary effort is normal. No respiratory distress.  Musculoskeletal:     Cervical back: Normal range of motion and neck supple.  Neurological:     Mental Status: He is alert.  Psychiatric:        Mood and Affect: Mood normal.         Behavior: Behavior normal.        Thought Content: Thought content normal.        Judgment: Judgment normal.       No results found for any visits on 02/20/20.  Assessment & Plan     1. Gastroesophageal reflux disease without esophagitis Will continue Protonix as below. If symptoms persist over the next month will consider GI referral for further evaluation of persistent GERD symptoms. Call if worsening. - pantoprazole (PROTONIX) 40 MG tablet; Take 1 tablet (40 mg total) by mouth daily.  Dispense: 90 tablet; Refill: 1   No follow-ups on file.      Delmer Islam, PA-C, have reviewed all documentation for this visit. The documentation on 02/20/20 for the exam, diagnosis, procedures, and orders are all accurate and complete.   Reine Just  [facility] [phone] (phone) [phone] (fax)  [facility]

## 2020-03-02 ENCOUNTER — Telehealth: Payer: Self-pay

## 2020-03-02 DIAGNOSIS — K219 Gastro-esophageal reflux disease without esophagitis: Secondary | ICD-10-CM

## 2020-03-02 NOTE — Telephone Encounter (Signed)
Copied from CRM [id number]. Topic: General - Other >> Mar 02, 2020  2:02 PM [name] wrote: Reason for CRM: Pt called and stated he wasn't able to schedule with [facility] provider using Mychart and wanted to know if Steven Sanchez can schedule the appt with GI/ please advise

## 2020-03-03 ENCOUNTER — Telehealth: Payer: Self-pay

## 2020-03-03 ENCOUNTER — Encounter: Payer: Self-pay | Admitting: Physician Assistant

## 2020-03-03 NOTE — Telephone Encounter (Signed)
Copied from CRM [id number]. Topic: Referral - Request for Referral >> Mar 03, 2020 12:28 PM Randol Kern wrote: Has patient seen PCP for this complaint? Yes.   *If NO, is insurance requiring patient see PCP for this issue before PCP can refer them? Referral for which specialty: GI Preferred provider/office: Highest recommended within insurance network/proximity  Reason for referral: 1 month and a half has been having stomach issues. Feels like he has heart burn/acid reflux. Lump in his throat to spit (a lot)

## 2020-03-03 NOTE — Telephone Encounter (Signed)
Referral placed.

## 2020-03-03 NOTE — Addendum Note (Signed)
Addended by: Margaretann Loveless on: 03/03/2020 12:41 PM   Modules accepted: Orders

## 2020-03-06 ENCOUNTER — Other Ambulatory Visit: Payer: Self-pay | Admitting: Medical

## 2020-04-01 ENCOUNTER — Telehealth: Payer: Self-pay | Admitting: Gastroenterology

## 2020-04-01 NOTE — Telephone Encounter (Signed)
LVM for patient to call our office about changing his appt. Date to 05/04/20 at 3:30pm per Ginger.

## 2020-04-12 ENCOUNTER — Encounter: Payer: Self-pay | Admitting: Physician Assistant

## 2020-04-12 ENCOUNTER — Ambulatory Visit: Payer: BC Managed Care – PPO | Admitting: Physician Assistant

## 2020-04-12 ENCOUNTER — Other Ambulatory Visit: Payer: Self-pay

## 2020-04-12 VITALS — BP 120/82 | HR 58 | Ht 67.0 in | Wt 151.6 lb

## 2020-04-12 DIAGNOSIS — Z8679 Personal history of other diseases of the circulatory system: Secondary | ICD-10-CM | POA: Diagnosis not present

## 2020-04-12 DIAGNOSIS — K219 Gastro-esophageal reflux disease without esophagitis: Secondary | ICD-10-CM | POA: Diagnosis not present

## 2020-04-12 DIAGNOSIS — I514 Myocarditis, unspecified: Secondary | ICD-10-CM

## 2020-04-12 MED ORDER — PANTOPRAZOLE SODIUM 40 MG PO TBEC: 40.0000 mg | DELAYED_RELEASE_TABLET | Freq: Every day | ORAL | 6 refills | Status: AC

## 2020-04-12 NOTE — Patient Instructions (Signed)
Medication Instructions:  - Your physician recommends that you continue on your current medications as directed. Please refer to the Current Medication list given to you today.  - Protonix has been refilled   *If you need a refill on your cardiac medications before your next appointment, please call your pharmacy*   Lab Work: - none ordered  If you have labs (blood work) drawn today and your tests are completely normal, you will receive your results only by: Marland Kitchen MyChart Message (if you have MyChart) OR . A paper copy in the mail If you have any lab test that is abnormal or we need to change your treatment, we will call you to review the results.   Testing/Procedures: - none ordered   Follow-Up: At [facility], you and your health needs are our priority.  As part of our continuing mission to provide you with exceptional heart care, we have created designated Provider Care Teams.  These Care Teams include your primary Cardiologist (physician) and Advanced Practice Providers (APPs -  Physician Assistants and Nurse Practitioners) who all work together to provide you with the care you need, when you need it.  We recommend signing up for the patient portal called "MyChart".  Sign up information is provided on this After Visit Summary.  MyChart is used to connect with patients for Virtual Visits (Telemedicine).  Patients are able to view lab/test results, encounter notes, upcoming appointments, etc.  Non-urgent messages can be sent to your provider as well.   To learn more about what you can do with MyChart, go to [email or web address].    Your next appointment:   6 month(s)  The format for your next appointment:   In Person  Provider:   Lorine Bears, MD   Other Instructions n/a

## 2020-04-12 NOTE — Progress Notes (Signed)
Office Visit    Patient Name: Steven Sanchez Date of Encounter: 04/12/2020  Primary Care Provider:  Margaretann Loveless, PA-C Primary Cardiologist:  Lorine Bears, MD  Chief Complaint    Chief Complaint  Patient presents with  . Follow-up     72m follow up; Complaint of heartburn and "gurgling."; Denies chest pain, shortness of breath, swelling or dizziness.     34 year old male with recent myopericarditis and cardiomyopathy after a viral illness and who returns for follow-up after 01/2020 echocardiogram, showing normalization of EF.  Past Medical History    History reviewed. No pertinent past medical history. History reviewed. No pertinent surgical history.  Allergies  No Known Allergies  History of Present Illness    Steven Sanchez is a 34 y.o. male with PMH as above.  He is a former smoker per EMR.  He was hospitalized for myopericarditis with discharge 12/15/2019 and after a viral infection.  He presented to [facility] with chest pain, fever, and myalgias and the preceding few days.  His temperature was 103 degrees with body aches.  His chest pain was sharp and pleuritic.  He tested negative for COVID-19 and had not yet had his COVID-19 vaccine yet.  EKG showed minimal ST elevation in the lateral leads.  Troponin was elevated at 7000.  Echo showed EF 45 to 50% with global hypokinesis.  He was treated with indomethacin and colchicine.  Troponin trended down.  Cardiac MRI showed findings consistent with mild pericarditis and small pericardial effusion.  EF of 50% with normal valvular structures.  He was discharged on small dose metoprolol, colchicine, indomethacin, and Protonix.  He called our care a few days later after hospital discharge with increased headache and dark discoloration of urine.  Concern was noted about possible rhabdomyolysis.  Labs were performed that showed normal CPK and renal function.  Potassium was low at 3.2.  CRP was elevated at 8.  CBC showed  leukopenia with white cell count of 3.8, mild anemia with hemoglobin 12.5, and thrombocytopenia at 111,000.  HIV test negative.  Concern for bone marrow suppression related to his treatment was noted and colchicine discontinued.    He underwent cardiac MRI with EF 50%.  At his last follow-up 12/20/19, he was noted to be improving each day.  He still noted occasional pleuritic chest pain.  No fevers or chills.  He had not needed acetaminophen in the previous few days.  He had stopped ibuprofen more than a week prior.  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  He was continued on metoprolol until his echo and then it was discontinued.  PPI discontinued.  Per review of EMR, metoprolol was discontinued 02/20/2020.  Repeat CBC and CRP perfromed for recheck with results unremarkable / wnl.   Follow-up echocardiogram performed 01/21/2020 and showed normalization of EF 55-60%.   Today, 04/12/20, he returns to clinic and states that he is doing well from a cardiac standpoint. No further pleuritic CP reported. He denies chest pain, palpitations, dyspnea, pnd, orthopnea, n, v, dizziness, syncope, edema, weight gain, or early satiety.  He engaged in a recent competitive game of doubles tennis without any symptoms.  He did note ongoing GERD-like symptoms and has been restarted on his PPI with an upcoming GI appointment set for further evaluation of his GERD.  His GERD sx are usually noted after eating and sometimes before  falling asleep or after and waking.  He describes gurgling associated with his GERD.  He is making an attempt to eat better to help alleviate the symptoms.  Home Medications    Prior to Admission medications   Medication Sig Start Date End Date Taking? Authorizing Provider  pantoprazole (PROTONIX) 40 MG tablet Take 1 tablet (40 mg total) by mouth daily. 02/20/20   Margaretann Loveless, PA-C    Review of Systems    He denies chest pain, palpitations, dyspnea, pnd, orthopnea, n, v, dizziness, syncope, edema, weight gain, or early satiety.   He reports ongoing symptoms of GERD as detailed above.  All other systems reviewed and are otherwise negative except as noted above.  Physical Exam    VS:  BP 120/82 (BP Location: Left Arm, Patient Position: Sitting, Cuff Size: Normal)   Pulse (!) 58   Ht 5\' 7"  (1.702 m)   Wt 151 lb 9.6 oz (68.8 kg)   SpO2 99%   BMI 23.74 kg/m  , BMI Body mass index is 23.74 kg/m. GEN: Well nourished, well developed, in no acute distress. HEENT: normal. Neck: Supple, no JVD, carotid bruits, or masses. Cardiac: RRR with rare extrasystole appreciated, no murmurs, rubs, or gallops. No clubbing, cyanosis, edema.  Radials/DP/PT 2+ and equal bilaterally.  Respiratory:  Respirations regular and unlabored, clear to auscultation bilaterally. GI: Soft, nontender, nondistended, BS + x 4. MS: no deformity or atrophy. Skin: warm and dry, no rash. Neuro:  Strength and sensation are intact. Psych: Normal affect.  Accessory Clinical Findings    ECG personally reviewed by me today -SB, 58bpm, QRS , IVCD with repolarization changes, QTc 437- no acute changes.  VITALS Reviewed today   Temp Readings from Last 3 Encounters:  02/20/20 (!) 97.1 F (36.2 C) (Temporal)  12/15/19 (!) 97.5 F (36.4 C) (Oral)  12/14/19 99.5 F (37.5 C) (Oral)   BP Readings from Last 3 Encounters:  04/12/20 120/82  02/20/20 115/75  01/06/20 112/64   Pulse Readings from Last 3 Encounters:  04/12/20 (!) 58  02/20/20 61  01/06/20 68    Wt Readings from Last 3 Encounters:  04/12/20 151 lb 9.6 oz (68.8 kg)  02/20/20 151 lb 3.2 oz (68.6 kg)  01/06/20 150 lb (68 kg)     LABS  reviewed today   Lab Results  Component Value Date   WBC 5.3 01/21/2020   HGB 13.8 01/21/2020   HCT 40.5 01/21/2020   MCV 88 01/21/2020   PLT 208 01/21/2020   Lab Results    Component Value Date   CREATININE 0.92 12/19/2019   BUN 12 12/19/2019   NA 138 12/19/2019   K 3.2 (L) 12/19/2019   CL 101 12/19/2019   CO2 26 12/19/2019   Lab Results  Component Value Date   ALT 37 12/15/2019   AST 72 (H) 12/15/2019   ALKPHOS 47 12/15/2019   BILITOT 0.9 12/15/2019   No results found for: CHOL, HDL, LDLCALC, LDLDIRECT, TRIG, CHOLHDL  No results found for: HGBA1C Lab Results  Component Value Date   TSH 1.162 12/15/2019     STUDIES/PROCEDURES reviewed today   Echo 01/21/20 1. Left ventricular ejection fraction, by estimation, is 55 to 60%. The  left ventricle has normal function. Left ventricular diastolic parameters  were normal.  2. Right ventricular systolic function is normal. The right ventricular  size is normal.  3. The mitral valve is normal in structure. No evidence of mitral valve  regurgitation.  4. The aortic  valve is normal in structure. Aortic valve regurgitation is  not visualized.   Cardiac MRI IMPRESSION: 1. Findings consistent with myopericarditis with uptake of gadolinium in myocardium that does not correspond to coronary distribution 2.  Small pericardial effusion 3.  Quantitative EF 50% with mild diffuse hypokinesis 4.  Normal RV size and function 5.  Normal cardiac valves  Echo 12/13/19 1. Mild diffuse hypokinesis no defined RWMA. Left ventricular ejection  fraction, by estimation, is 45 to 50%. The left ventricle has mildly  decreased function. The left ventricle demonstrates global hypokinesis.  Left ventricular diastolic parameters  were normal.  2. Right ventricular systolic function is normal. The right ventricular  size is normal.  3. The mitral valve is normal in structure. No evidence of mitral valve  regurgitation.  4. The aortic valve is tricuspid. Aortic valve regurgitation is not  visualized. No aortic stenosis is present.   Assessment & Plan    Myopericarditis --History of myopericarditis in the  setting of viral illness 11/2019. Colchicine was discontinued due to concerns regarding bone marrow suppression.  HIV test negative.  Repeat CBC and CRP WNL.  Repeat echo as above with normalization of EF to 55 -60%. He is symptom-free today and recently played doubles tennis played competitively without symptoms.  No further workup at this time.  --He asks today if he can receive the COVID-19 vaccine, which was discussed with his primary cardiologist after his visit given the very low risk of myopericarditis associated with vaccination per most recent data.  After consideration of risks versus benefits, and discussion with his primary cardiologist, recommendation is that he receive the COVID-19 vaccine.  I was not able to reach him by telephone today to update him with this recommendation so sent a message to triage.  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  Repeat echo with normalization of EF 55 to 60%.  No further work-up needed.  GERD --He intends to follow-up with GI regarding his ongoing symptoms of GERD.  Recommended he elevate his head at night to see if this helps relieve his symptoms before he falls asleep and when he wakes up in the morning.  Further recommendations per GI.   Disposition:  Patient to be updated he can receive the COVID-19 vaccine.   Follow-up with Dr. Kirke Corin in 6 months.   Lennon Alstrom, PA-C 04/12/2020

## 2020-04-13 ENCOUNTER — Telehealth: Payer: Self-pay | Admitting: *Deleted

## 2020-04-13 NOTE — Telephone Encounter (Signed)
No answer. Ok to leave detailed message per [facility]. Recommendations left on VM and that patient can call back if he has any further questions.

## 2020-04-13 NOTE — Telephone Encounter (Signed)
-----   Message from Lennon Alstrom, PA-C sent at 04/12/2020  5:21 PM EDT ----- Please call the patient and let him know I was able to speak with Dr. Kirke Corin about the COVID-19 vaccine.    There is a small chance of myopericarditis with the COVID-19 vaccine; however, given his lack of symptoms in clinic today and the risk associated with COVID-19, Dr. Kirke Corin does recommend the COVID-19 vaccine at this time.

## 2020-05-06 ENCOUNTER — Ambulatory Visit: Payer: BC Managed Care – PPO | Admitting: Gastroenterology

## 2020-06-29 ENCOUNTER — Encounter: Payer: Self-pay | Admitting: Gastroenterology

## 2020-06-29 ENCOUNTER — Other Ambulatory Visit: Payer: Self-pay

## 2020-06-29 ENCOUNTER — Ambulatory Visit: Payer: 59 | Admitting: Gastroenterology

## 2020-06-29 VITALS — BP 125/68 | HR 73 | Ht 67.0 in | Wt 149.6 lb

## 2020-06-29 DIAGNOSIS — K219 Gastro-esophageal reflux disease without esophagitis: Secondary | ICD-10-CM | POA: Diagnosis not present

## 2020-06-29 DIAGNOSIS — R1013 Epigastric pain: Secondary | ICD-10-CM

## 2020-06-29 NOTE — Progress Notes (Signed)
Gastroenterology Consultation  Referring Provider:     Suella Grove* Primary Care Physician:  Margaretann Loveless, PA-C Primary Gastroenterologist:  Dr. Servando Snare     Reason for Consultation:     GERD        HPI:   Steven Sanchez is a 34 y.o. y/o male referred for consultation & management of GERD by Dr. Rosezetta Schlatter, Alessandra Bevels, PA-C.  This patient comes in today with a history of GERD.  The patient reports that he had an episode of myocarditis and since then has been having symptoms of GERD.  He states he is usually helped with his pantoprazole but if he misses a dose he starts to have symptoms.  Despite taking pantoprazole he does have some acid breakthrough when he eats spicy foods such as pizza.  There is no report of any nausea vomiting fevers or chills.  The patient reports that he has had episodes of gurgling in his chest.  He feels like it comes with his heartburn.  The patient also has a history of cardiomyopathy after a viral illness.  The patient was referred due to ongoing reflux symptoms.  The symptoms can happen in at any time throughout the day and is sometimes not associated with eating.  History reviewed. No pertinent past medical history.  History reviewed. No pertinent surgical history.  Prior to Admission medications   Medication Sig Start Date End Date Taking? Authorizing Provider  pantoprazole (PROTONIX) 40 MG tablet Take 1 tablet (40 mg total) by mouth daily. 04/12/20  Yes Visser, Jacquelyn D, PA-C  valACYclovir (VALTREX) 500 MG tablet Take 500 mg by mouth 2 (two) times daily. 04/05/20  Yes [provider]    Family History  Problem Relation Age of Onset   Hypertension Father      Social History   Tobacco Use   Smoking status: Former Smoker    Types: Cigarettes    Quit date: 12/11/2019    Years since quitting: 0.5   Smokeless tobacco: Never Used  Vaping Use   Vaping Use: Never used  Substance Use Topics   Alcohol use: Yes     Alcohol/week: 5.0 standard drinks    Types: 5 Cans of beer per week    Comment: occasional   Drug use: No    Allergies as of 06/29/2020   (No Known Allergies)    Review of Systems:    All systems reviewed and negative except where noted in HPI.   Physical Exam:  BP 125/68    Pulse 73    Ht 5\' 7"  (1.702 m)    Wt 149 lb 9.6 oz (67.9 kg)    BMI 23.43 kg/m  No LMP for male patient. General:   Alert,  Well-developed, well-nourished, pleasant and cooperative in NAD Head:  Normocephalic and atraumatic. Eyes:  Sclera clear, no icterus.   Conjunctiva pink. Ears:  Normal auditory acuity. Neck:  Supple; no masses or thyromegaly. Lungs:  Respirations even and unlabored.  Clear throughout to auscultation.   No wheezes, crackles, or rhonchi. No acute distress. Heart:  Regular rate and rhythm; no murmurs, clicks, rubs, or gallops. Abdomen:  Normal bowel sounds.  No bruits.  Soft, non-tender and non-distended without masses, hepatosplenomegaly or hernias noted.  No guarding or rebound tenderness.  Negative Carnett sign.   Rectal:  Deferred.  Pulses:  Normal pulses noted. Extremities:  No clubbing or edema.  No cyanosis. Neurologic:  Alert and oriented x3;  grossly normal neurologically. Skin:  Intact without significant lesions or rashes.  No jaundice. Lymph Nodes:  No significant cervical adenopathy. Psych:  Alert and cooperative. Normal mood and affect.  Imaging Studies: No results found.  Assessment and Plan:   Steven Sanchez is a 34 y.o. y/o male who comes in today with ongoing reflux symptoms and a report of what he says is gurgling in his chest typically her throughout the day.  The patient also has significant acid breakthrough despite taking a PPI.  The patient does take it intermittently and sometimes forgets it.  The patient has been told to continue to take the PPI every day and he will be set up for an upper endoscopy to look for any hiatal hernia or damage due to the  chronic reflux. The patient has The patient has been explained plan and agrees with it.    Midge Minium, MD. Clementeen Graham    Note: This dictation was prepared with Dragon dictation along with smaller phrase technology. Any transcriptional errors that result from this process are unintentional.

## 2020-07-01 ENCOUNTER — Other Ambulatory Visit: Payer: Self-pay

## 2020-07-01 DIAGNOSIS — K219 Gastro-esophageal reflux disease without esophagitis: Secondary | ICD-10-CM

## 2020-07-30 ENCOUNTER — Other Ambulatory Visit: Payer: Self-pay

## 2020-07-30 ENCOUNTER — Other Ambulatory Visit
Admission: RE | Admit: 2020-07-30 | Discharge: 2020-07-30 | Disposition: A | Discharge status: Home / Self Care | Payer: 59 | Source: Ambulatory Visit | Attending: Gastroenterology | Admitting: Gastroenterology

## 2020-07-30 DIAGNOSIS — Z20822 Contact with and (suspected) exposure to covid-19: Secondary | ICD-10-CM | POA: Diagnosis not present

## 2020-07-30 DIAGNOSIS — Z01812 Encounter for preprocedural laboratory examination: Secondary | ICD-10-CM | POA: Diagnosis not present

## 2020-07-31 LAB — SARS CORONAVIRUS 2 (TAT 6-24 HRS): SARS Coronavirus 2: NEGATIVE

## 2020-08-02 ENCOUNTER — Encounter: Payer: Self-pay | Admitting: Gastroenterology

## 2020-08-03 ENCOUNTER — Ambulatory Visit: Payer: 59 | Admitting: Certified Registered"

## 2020-08-03 ENCOUNTER — Encounter: Payer: Self-pay | Admitting: Gastroenterology

## 2020-08-03 ENCOUNTER — Ambulatory Visit
Admission: RE | Admit: 2020-08-03 | Discharge: 2020-08-03 | Disposition: A | Discharge status: Home / Self Care | Payer: 59 | Attending: Gastroenterology | Admitting: Gastroenterology

## 2020-08-03 ENCOUNTER — Other Ambulatory Visit: Payer: Self-pay

## 2020-08-03 ENCOUNTER — Encounter
Admission: RE | Disposition: A | Discharge status: Home / Self Care | Payer: Self-pay | Source: Home / Self Care | Attending: Gastroenterology

## 2020-08-03 DIAGNOSIS — R12 Heartburn: Secondary | ICD-10-CM | POA: Diagnosis present

## 2020-08-03 DIAGNOSIS — K219 Gastro-esophageal reflux disease without esophagitis: Secondary | ICD-10-CM

## 2020-08-03 DIAGNOSIS — Z87891 Personal history of nicotine dependence: Secondary | ICD-10-CM | POA: Insufficient documentation

## 2020-08-03 HISTORY — PX: ESOPHAGOGASTRODUODENOSCOPY (EGD) WITH PROPOFOL: SHX5813

## 2020-08-03 SURGERY — ESOPHAGOGASTRODUODENOSCOPY (EGD) WITH PROPOFOL
Anesthesia: General

## 2020-08-03 MED ORDER — GLYCOPYRROLATE 0.2 MG/ML IJ SOLN
INTRAMUSCULAR | Status: DC | PRN
  Administered 2020-08-03: .2 mg via INTRAVENOUS

## 2020-08-03 MED ORDER — LIDOCAINE HCL (CARDIAC) PF 100 MG/5ML IV SOSY
PREFILLED_SYRINGE | INTRAVENOUS | Status: DC | PRN
  Administered 2020-08-03: 50 mg via INTRAVENOUS

## 2020-08-03 MED ORDER — SODIUM CHLORIDE 0.9 % IV SOLN: INTRAVENOUS | Status: DC

## 2020-08-03 MED ORDER — PROPOFOL 10 MG/ML IV BOLUS
INTRAVENOUS | Status: DC | PRN
  Administered 2020-08-03 (×2): 100 mg via INTRAVENOUS
  Administered 2020-08-03: 50 mg via INTRAVENOUS

## 2020-08-03 MED ORDER — PROPOFOL 500 MG/50ML IV EMUL
INTRAVENOUS | Status: AC
  Filled 2020-08-03: qty 50

## 2020-08-03 NOTE — Op Note (Signed)
[facility] Patient Name: Steven Sanchez Procedure Date: 08/03/2020 9:35 AM MRN: [id number] Account #: [id number] Date of Birth: [birth year] Admit Type: Outpatient Age: 34 Room: [facility] ENDO ROOM 4 Gender: Male Note Status: Finalized Procedure:             Upper GI endoscopy Indications:           Heartburn Providers:             Midge Minium MD, MD Referring MD:          No Local Md, MD (Referring MD) Medicines:             Propofol per Anesthesia Complications:         No immediate complications. Procedure:             Pre-Anesthesia Assessment:                        - Prior to the procedure, a History and Physical was                         performed, and patient medications and allergies were                         reviewed. The patient's tolerance of previous                         anesthesia was also reviewed. The risks and benefits                         of the procedure and the sedation options and risks                         were discussed with the patient. All questions were                         answered, and informed consent was obtained. Prior                         Anticoagulants: The patient has taken no previous                         anticoagulant or antiplatelet agents. ASA Grade                         Assessment: II - A patient with mild systemic disease.                         After reviewing the risks and benefits, the patient                         was deemed in satisfactory condition to undergo the                         procedure.                        After obtaining informed consent, the endoscope was  passed under direct vision. Throughout the procedure,                         the patient's blood pressure, pulse, and oxygen                         saturations were monitored continuously. The Endoscope                         was introduced through the mouth, and advanced to the                          second part of duodenum. The upper GI endoscopy was                         accomplished without difficulty. The patient tolerated                         the procedure well. Findings:      The Z-line was irregular and was found at the gastroesophageal junction.      The stomach was normal.      The examined duodenum was normal. Impression:            - Z-line irregular, at the gastroesophageal junction.                        - Normal stomach.                        - Normal examined duodenum.                        - No specimens collected. Recommendation:        - Discharge patient to home.                        - Resume previous diet.                        - Continue present medications.                        - Consider anti reflux surgery. Procedure Code(s):     --- Professional ---                        [phone], Esophagogastroduodenoscopy, flexible,                         transoral; diagnostic, including collection of                         specimen(s) by brushing or washing, when performed                         (separate procedure) Diagnosis Code(s):     --- Professional ---                        R12, Heartburn CPT copyright 2019 American Medical Association. All rights reserved. The codes documented in this report are preliminary and upon coder review may  be revised to meet current compliance requirements. Midge Minium MD, MD 08/03/2020 9:45:40 AM This report has been signed electronically. Number of Addenda: 0 Note Initiated On: 08/03/2020 9:35 AM Estimated Blood Loss:  Estimated blood loss: none.      [facility]

## 2020-08-03 NOTE — H&P (Signed)
   Steven Minium, MD [facility] [address] [place], [place] [address] Phone:[phone] Fax : [phone]  Primary Care Physician:  Margaretann Loveless, PA-C Primary Gastroenterologist:  Dr. Servando Snare  Pre-Procedure History & Physical: HPI:  Steven Sanchez is a 34 y.o. male is here for an endoscopy.   History reviewed. No pertinent past medical history.  History reviewed. No pertinent surgical history.  Prior to Admission medications   Medication Sig Start Date End Date Taking? Authorizing Provider  pantoprazole (PROTONIX) 40 MG tablet Take 1 tablet (40 mg total) by mouth daily. 04/12/20  Yes Visser, Jacquelyn D, PA-C  valACYclovir (VALTREX) 500 MG tablet Take 500 mg by mouth 2 (two) times daily. 04/05/20  Yes [provider]    Allergies as of 07/01/2020  . (No Known Allergies)    Family History  Problem Relation Age of Onset  . Hypertension Father     Social History   Socioeconomic History  . Marital status: Married    Spouse name: Not on file  . Number of children: Not on file  . Years of education: Not on file  . Highest education level: Not on file  Occupational History  . Occupation: Tennis pro  Tobacco Use  . Smoking status: Former Smoker    Types: Cigarettes    Quit date: 12/11/2019    Years since quitting: 0.6  . Smokeless tobacco: Never Used  Vaping Use  . Vaping Use: Never used  Substance and Sexual Activity  . Alcohol use: Yes    Alcohol/week: 5.0 standard drinks    Types: 5 Cans of beer per week    Comment: occasional  . Drug use: No  . Sexual activity: Yes  Other Topics Concern  . Not on file  Social History Narrative   Lives with wife and one step-daughter   Social Determinants of Health   Financial Resource Strain: Not on file  Food Insecurity: Not on file  Transportation Needs: Not on file  Physical Activity: Not on file  Stress: Not on file  Social Connections: Not on file  Intimate Partner Violence: Not on file     Review of Systems: See HPI, otherwise negative ROS  Physical Exam: BP 131/81   Pulse 67   Temp (!) 97.3 F (36.3 C) (Temporal)   Resp 18   Ht 5\' 7"  (1.702 m)   Wt 68.9 kg   SpO2 100%   BMI 23.81 kg/m  General:   Alert,  pleasant and cooperative in NAD Head:  Normocephalic and atraumatic. Neck:  Supple; no masses or thyromegaly. Lungs:  Clear throughout to auscultation.    Heart:  Regular rate and rhythm. Abdomen:  Soft, nontender and nondistended. Normal bowel sounds, without guarding, and without rebound.   Neurologic:  Alert and  oriented x4;  grossly normal neurologically.  Impression/Plan: Bari Handshoe is here for an endoscopy to be performed for GERD  Risks, benefits, limitations, and alternatives regarding  endoscopy have been reviewed with the patient.  Questions have been answered.  All parties agreeable.   Aron Baba, MD  08/03/2020, 9:25 AM

## 2020-08-03 NOTE — Anesthesia Preprocedure Evaluation (Signed)
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 No pertinent surgical history.  BMI    Body Mass Index: 23.81 kg/m      Reproductive/Obstetrics negative OB ROS                             Anesthesia Physical Anesthesia Plan  ASA: II  Anesthesia Plan: General   Post-op Pain Management:    Induction: Intravenous  PONV Risk Score and Plan: Propofol infusion and TIVA  Airway Management Planned: Natural Airway and Nasal Cannula  Additional Equipment:   Intra-op Plan:   Post-operative Plan:   Informed Consent: I have reviewed the patients History and Physical, chart, labs and discussed the procedure including the risks, benefits and alternatives for the proposed anesthesia with the patient or authorized representative who has indicated his/her understanding and acceptance.     Dental Advisory Given  Plan Discussed with: Anesthesiologist, CRNA and Surgeon  Anesthesia Plan Comments: (Patient consented for  risks of anesthesia including but not limited to:  - adverse reactions to medications - risk of airway placement if required - damage to eyes, teeth, lips or other oral mucosa - nerve damage due to positioning  - sore throat or hoarseness - Damage to heart, brain, nerves, lungs, other parts of body or loss of life  Patient voiced understanding.)        Anesthesia Quick Evaluation

## 2020-08-03 NOTE — Anesthesia Postprocedure Evaluation (Signed)
Anesthesia Post Note  Patient: Steven Sanchez  Procedure(s) Performed: ESOPHAGOGASTRODUODENOSCOPY (EGD) WITH PROPOFOL (N/A )  Patient location during evaluation: Endoscopy Anesthesia Type: General Level of consciousness: awake and alert Pain management: pain level controlled Vital Signs Assessment: post-procedure vital signs reviewed and stable Respiratory status: spontaneous breathing, nonlabored ventilation, respiratory function stable and patient connected to nasal cannula oxygen Cardiovascular status: blood pressure returned to baseline and stable Postop Assessment: no apparent nausea or vomiting Anesthetic complications: no   No complications documented.   Last Vitals:  Vitals:   08/03/20 0920 08/03/20 0940  BP: 131/81 100/61  Pulse: 67   Resp: 18 18  Temp: (!) 36.3 C (!) 36.3 C  SpO2: 100% 97%    Last Pain:  Vitals:   08/03/20 0940  TempSrc: Temporal  PainSc:                  Cleda Mccreedy Jrake Rodriquez

## 2020-08-03 NOTE — Transfer of Care (Signed)
Immediate Anesthesia Transfer of Care Note  Patient: Steven Sanchez  Procedure(s) Performed: ESOPHAGOGASTRODUODENOSCOPY (EGD) WITH PROPOFOL (N/A )  Patient Location: PACU and Endoscopy Unit  Anesthesia Type:General  Level of Consciousness: drowsy  Airway & Oxygen Therapy: Patient Spontanous Breathing  Post-op Assessment: Report given to RN  Post vital signs: stable  Last Vitals:  Vitals Value Taken Time  BP    Temp    Pulse    Resp    SpO2      Last Pain:  Vitals:   08/03/20 0920  TempSrc: Temporal  PainSc: 0-No pain         Complications: No complications documented.

## 2020-08-04 ENCOUNTER — Encounter: Payer: Self-pay | Admitting: Gastroenterology

## 2020-10-14 NOTE — Progress Notes (Deleted)
    Virtual telephone visit    Virtual Visit via Telephone Note   This visit type was conducted due to national recommendations for restrictions regarding the COVID-19 Pandemic (e.g. social distancing) in an effort to limit this patient's exposure and mitigate transmission in our community. Due to his co-morbid illnesses, this patient is at least at moderate risk for complications without adequate follow up. This format is felt to be most appropriate for this patient at this time. The patient did not have access to video technology or had technical difficulties with video requiring transitioning to audio format only (telephone). Physical exam was limited to content and character of the telephone converstion.    Patient location: *** Provider location: ***  I discussed the limitations of evaluation and management by telemedicine and the availability of in person appointments. The patient expressed understanding and agreed to proceed.   Visit Date: 10/15/2020  Today's healthcare provider: Margaretann Loveless, PA-C   No chief complaint on file.  Subjective    HPI  ***    {Show patient history (optional):23778::" "}  Medications: Outpatient Medications Prior to Visit  Medication Sig  . pantoprazole (PROTONIX) 40 MG tablet Take 1 tablet (40 mg total) by mouth daily.  . valACYclovir (VALTREX) 500 MG tablet Take 500 mg by mouth 2 (two) times daily.   No facility-administered medications prior to visit.    Review of Systems  {Labs  Heme  Chem  Endocrine  Serology  Results Review (optional):23779::" "}  Objective    There were no vitals taken for this visit. {Show previous vital signs (optional):23777::" "}    Assessment & Plan     ***  No follow-ups on file.    I discussed the assessment and treatment plan with the patient. The patient was provided an opportunity to ask questions and all were answered. The patient agreed with the plan and demonstrated an  understanding of the instructions.   The patient was advised to call back or seek an in-person evaluation if the symptoms worsen or if the condition fails to improve as anticipated.  I provided *** minutes of non-face-to-face time during this encounter.  {provider attestation***:1}  [facility] [phone] (phone) [phone] (fax)  [facility]

## 2020-10-15 ENCOUNTER — Telehealth: Payer: 59 | Admitting: Physician Assistant

## 2020-10-19 ENCOUNTER — Other Ambulatory Visit: Payer: Self-pay

## 2020-10-19 ENCOUNTER — Ambulatory Visit: Payer: 59 | Admitting: Cardiovascular Disease

## 2020-10-19 ENCOUNTER — Encounter: Payer: Self-pay | Admitting: Cardiovascular Disease

## 2020-10-19 VITALS — BP 110/60 | HR 66 | Ht 67.0 in | Wt 151.1 lb

## 2020-10-19 DIAGNOSIS — I514 Myocarditis, unspecified: Secondary | ICD-10-CM

## 2020-10-19 NOTE — Patient Instructions (Signed)
Medication Instructions:  Your physician recommends that you continue on your current medications as directed. Please refer to the Current Medication list given to you today.  *If you need a refill on your cardiac medications before your next appointment, please call your pharmacy*   Lab Work: None ordered If you have labs (blood work) drawn today and your tests are completely normal, you will receive your results only by: . MyChart Message (if you have MyChart) OR . A paper copy in the mail If you have any lab test that is abnormal or we need to change your treatment, we will call you to review the results.   Testing/Procedures: None ordered   Follow-Up: At [facility], you and your health needs are our priority.  As part of our continuing mission to provide you with exceptional heart care, we have created designated Provider Care Teams.  These Care Teams include your primary Cardiologist (physician) and Advanced Practice Providers (APPs -  Physician Assistants and Nurse Practitioners) who all work together to provide you with the care you need, when you need it.  We recommend signing up for the patient portal called "MyChart".  Sign up information is provided on this After Visit Summary.  MyChart is used to connect with patients for Virtual Visits (Telemedicine).  Patients are able to view lab/test results, encounter notes, upcoming appointments, etc.  Non-urgent messages can be sent to your provider as well.   To learn more about what you can do with MyChart, go to [email or web address].    Your next appointment:   As needed   The format for your next appointment:   In Person  Provider:   You may see Muhammad Arida, MD or one of the following Advanced Practice Providers on your designated Care Team:    Christopher Berge, NP  Ryan Dunn, PA-C  Jacquelyn Visser, PA-C  Cadence Furth, PA-C  Caitlin Walker, NP    Other Instructions N/A  

## 2020-10-19 NOTE — Progress Notes (Signed)
Cardiology Office Note   Date:  10/19/2020   ID:  Steven Sanchez, DOB 1985/09/13, MRN [id number]  PCP:  Margaretann Loveless, PA-C  Cardiologist:   Lorine Bears, MD   Chief Complaint  Patient presents with  . 6 month follow up     "doing well." Medications reviewed by the patient verbally.       History of Present Illness: Steven Sanchez is a 35 y.o. male who presents for a follow-up visit regardingmyopericarditis.  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  He presented in April 2021 with chest pain, fever and myalgia with flulike symptoms in the preceding few days.  His temperature was 103 with body aches.  His chest pain was sharp and pleuritic.  Covid negative and no history of Covid vaccine. EKG showed minimal ST elevation in the lateral leads.  Troponin was elevated at 7000.  Echocardiogram showed an EF of 45 to 50% with global hypokinesis.  He was treated with indomethacin and colchicine.  Troponin trended down.  Cardiac MRI showed findings consistent with myopericarditis with small pericardial effusion and EF of 50% and normal valvular structures.  He was discharged home on small dose metoprolol in addition to colchicine, indomethacin and Protonix.  Colchicine was subsequently discontinued due to mild bone marrow suppression. He had a repeat echocardiogram in June 2021 which showed normalization of LV systolic function with an EF of 55 to 60%.  His labs subsequently went back to normal. He has been doing very well with no recurrent chest pain, shortness of breath or palpitations.   History reviewed. No pertinent past medical history.  Past Surgical History:  Procedure Laterality Date  . ESOPHAGOGASTRODUODENOSCOPY (EGD) WITH PROPOFOL N/A 08/03/2020   Procedure: ESOPHAGOGASTRODUODENOSCOPY (EGD) WITH PROPOFOL;  Surgeon: Midge Minium, MD;  Location: [facility] ENDOSCOPY;  Service: Endoscopy;  Laterality:  N/A;     Current Outpatient Medications  Medication Sig Dispense Refill  . pantoprazole (PROTONIX) 40 MG tablet Take 1 tablet (40 mg total) by mouth daily. 60 tablet 6  . valACYclovir (VALTREX) 500 MG tablet Take 500 mg by mouth 2 (two) times daily.     No current facility-administered medications for this visit.    Allergies:   Patient has no known allergies.    Social History:  The patient  reports that he quit smoking about 10 months ago. His smoking use included cigarettes. He has never used smokeless tobacco. He reports current alcohol use of about 5.0 standard drinks of alcohol per week. He reports that he does not use drugs.   Family History:  The patient's family history includes Hypertension in his father.    ROS:  Please see the history of present illness.   Otherwise, review of systems are positive for none.   All other systems are reviewed and negative.    PHYSICAL EXAM: VS:  BP 110/60 (BP Location: Left Arm, Patient Position: Sitting, Cuff Size: Normal)   Pulse 66   Ht 5\' 7"  (1.702 m)   Wt 151 lb 2 oz (68.5 kg)   SpO2 98%   BMI 23.67 kg/m  , BMI Body mass index is 23.67 kg/m. GEN: Well nourished, well developed, in no acute distress  HEENT: normal  Neck: no JVD, carotid bruits, or masses Cardiac: RRR; no murmurs, rubs, or gallops,no edema  Respiratory:  clear to auscultation bilaterally, normal work of breathing GI: soft, nontender, nondistended, + BS MS: no deformity or  atrophy  Skin: warm and dry, no rash Neuro:  Strength and sensation are intact Psych: euthymic mood, full affect   EKG:  EKG is ordered today. The ekg ordered today demonstrates normal sinus rhythm with no significant ST or T wave changes.   Recent Labs: 12/15/2019: ALT 37; TSH 1.162 12/19/2019: BUN 12; Creatinine, Ser 0.92; Potassium 3.2; Sodium 138 01/21/2020: Hemoglobin 13.8; Platelets 208    Lipid Panel No results found for: CHOL, TRIG, HDL, CHOLHDL, VLDL, LDLCALC, LDLDIRECT     Wt Readings from Last 3 Encounters:  10/19/20 151 lb 2 oz (68.5 kg)  08/03/20 152 lb (68.9 kg)  06/29/20 149 lb 9.6 oz (67.9 kg)        No flowsheet data found.    ASSESSMENT AND PLAN:  1. History of myopericarditis: No recurrent symptoms. His ejection fraction returned to normal. He is doing extremely well with no physical limitations. He can follow-up with [place] as needed.  2.  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    Disposition:   FU with me in as needed.  Signed,  Lorine Bears, MD  10/19/2020 2:20 PM    [facility]

## 2021-01-19 ENCOUNTER — Ambulatory Visit: Payer: 59 | Admitting: Nurse Practitioner

## 2021-01-19 NOTE — Progress Notes (Deleted)
   There were no vitals taken for this visit.   Subjective:    Patient ID: Steven Sanchez, male    DOB: 04-14-1986, 35 y.o.   MRN: [id number]  HPI: Steven Sanchez is a 35 y.o. male  No chief complaint on file.  RASH Duration:  {Blank single:19197::"chronic","days","weeks","months"}  Location: {Blank multiple:19196::"generalized","trunk","face","hands","arms","legs","groin"}  Itching: {Blank single:19197::"yes","no"} Burning: {Blank single:19197::"yes","no"} Redness: {Blank single:19197::"yes","no"} Oozing: {Blank single:19197::"yes","no"} Scaling: {Blank single:19197::"yes","no"} Blisters: {Blank single:19197::"yes","no"} Painful: {Blank single:19197::"yes","no"} Fevers: {Blank single:19197::"yes","no"} Change in detergents/soaps/personal care products: {Blank single:19197::"yes","no"} Recent illness: {Blank single:19197::"yes","no"} Recent travel:{Blank single:19197::"yes","no"} History of same: {Blank single:19197::"yes","no"} Context: {Blank multiple:19196::"better","worse","stable","fluctuating","contacts with the same"} Alleviating factors: {Blank multiple:19196::"hydrocortisone cream","benadryl","lotion/moisturizer","nothing"} Treatments attempted:{Blank multiple:19196::"hydrocortisone cream","benadryl","OTC anit-fungal","lotion/moisturizer","nothing"} Shortness of breath: {Blank single:19197::"yes","no"}  Throat/tongue swelling: {Blank single:19197::"yes","no"} Myalgias/arthralgias: {Blank single:19197::"yes","no"}  Relevant past medical, surgical, family and social history reviewed and updated as indicated. Interim medical history since our last visit reviewed. Allergies and medications reviewed and updated.  Review of Systems  Per HPI unless specifically indicated above     Objective:    There were no vitals taken for this visit.  Wt Readings from Last 3 Encounters:  10/19/20 151 lb 2 oz (68.5 kg)  08/03/20 152 lb (68.9 kg)  06/29/20 149 lb 9.6 oz  (67.9 kg)    Physical Exam  Results for orders placed or performed during the hospital encounter of 07/30/20  SARS CORONAVIRUS 2 (TAT 6-24 HRS) Nasopharyngeal Nasopharyngeal Swab   Specimen: Nasopharyngeal Swab  Result Value Ref Range   SARS Coronavirus 2 NEGATIVE NEGATIVE      Assessment & Plan:   Problem List Items Addressed This Visit   None      Follow up plan: No follow-ups on file.

## 2021-11-13 IMAGING — CR DG CHEST 2V
1 series · 2 of 2 positions shown · non-contrast
Comparison: None

CLINICAL DATA: Fever, history of negative COVID test.

EXAM:
CHEST - 2 VIEW

[Series 1: dg chest 2 view · 0.14mm/px · 2 of 2 slices shown]
[im 1/2]
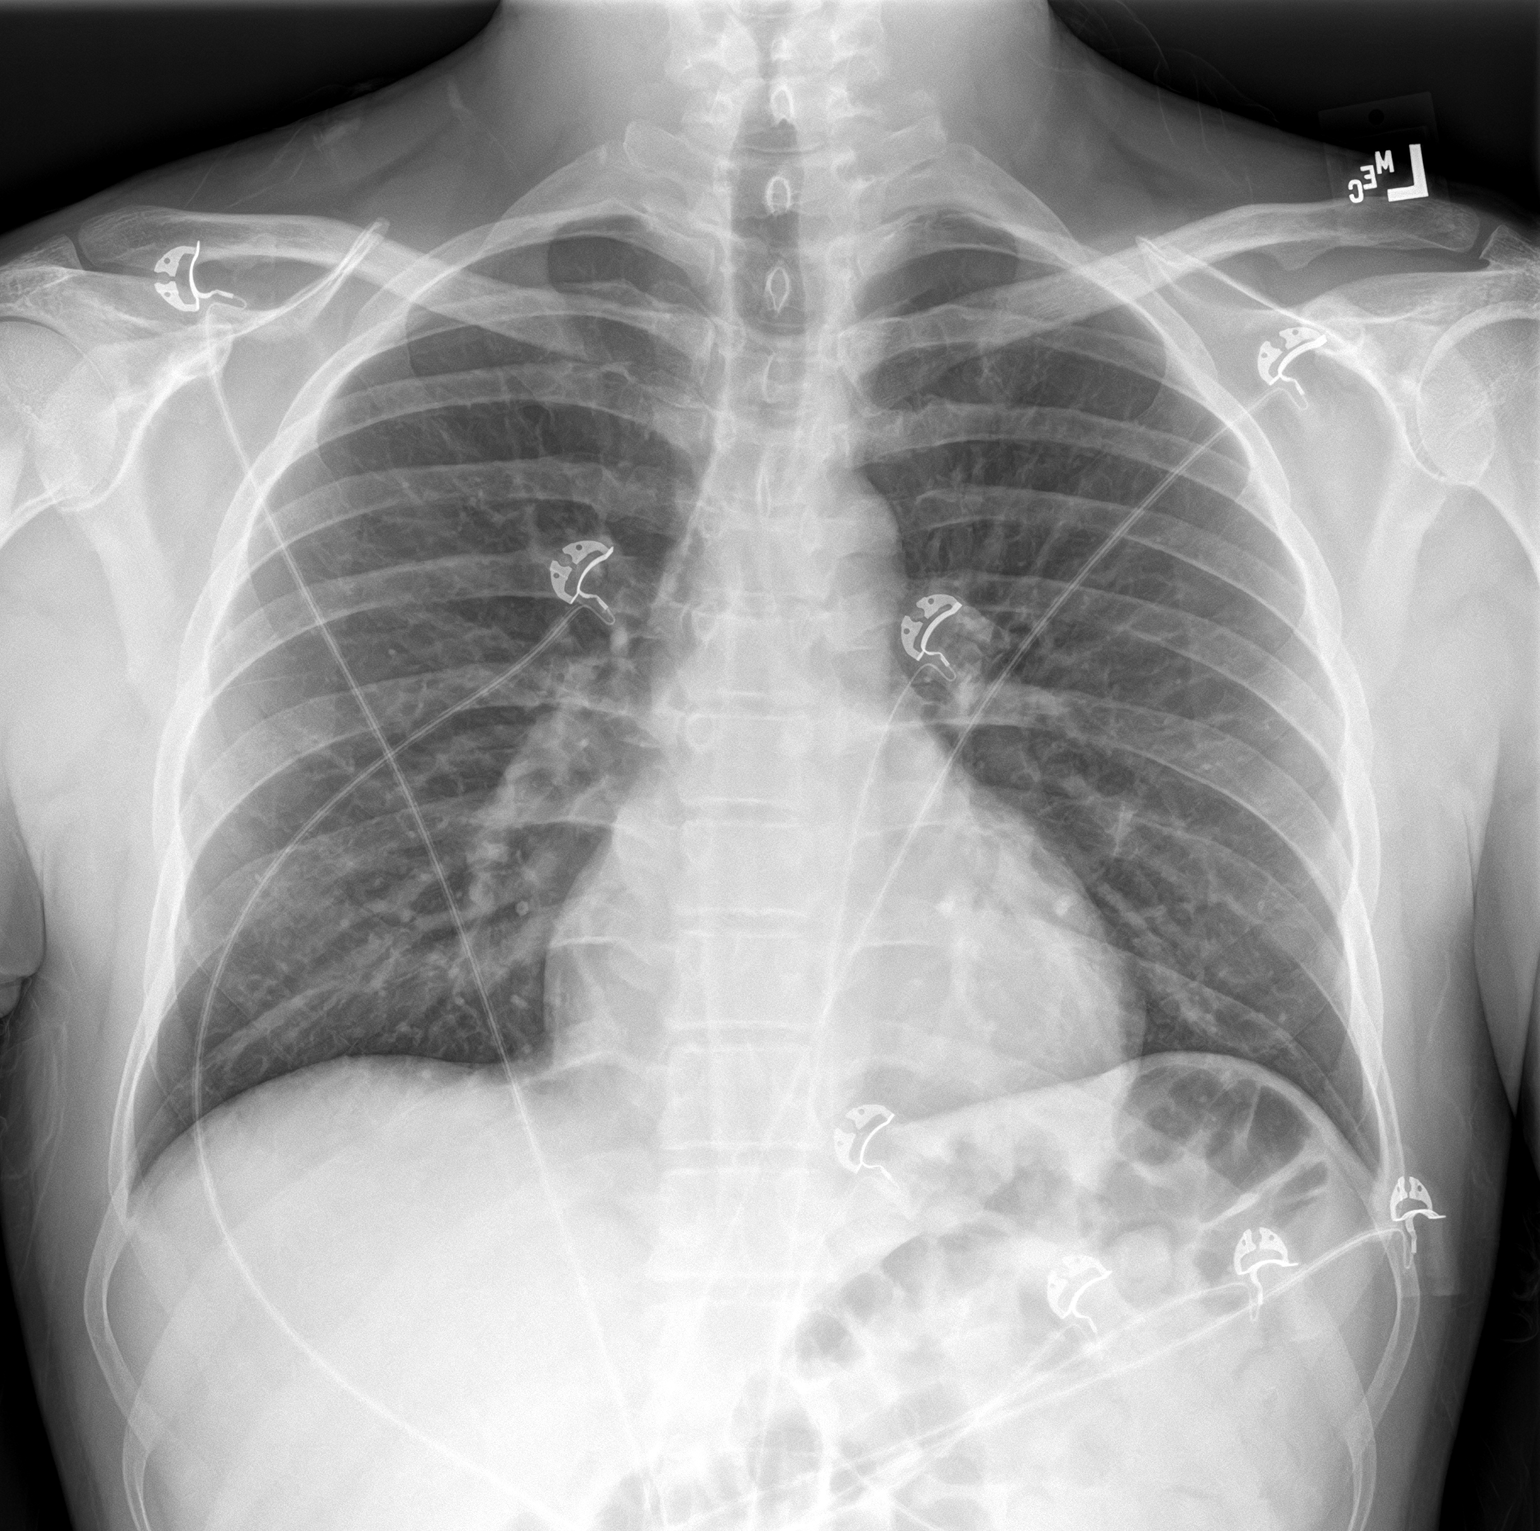
[im 2/2]
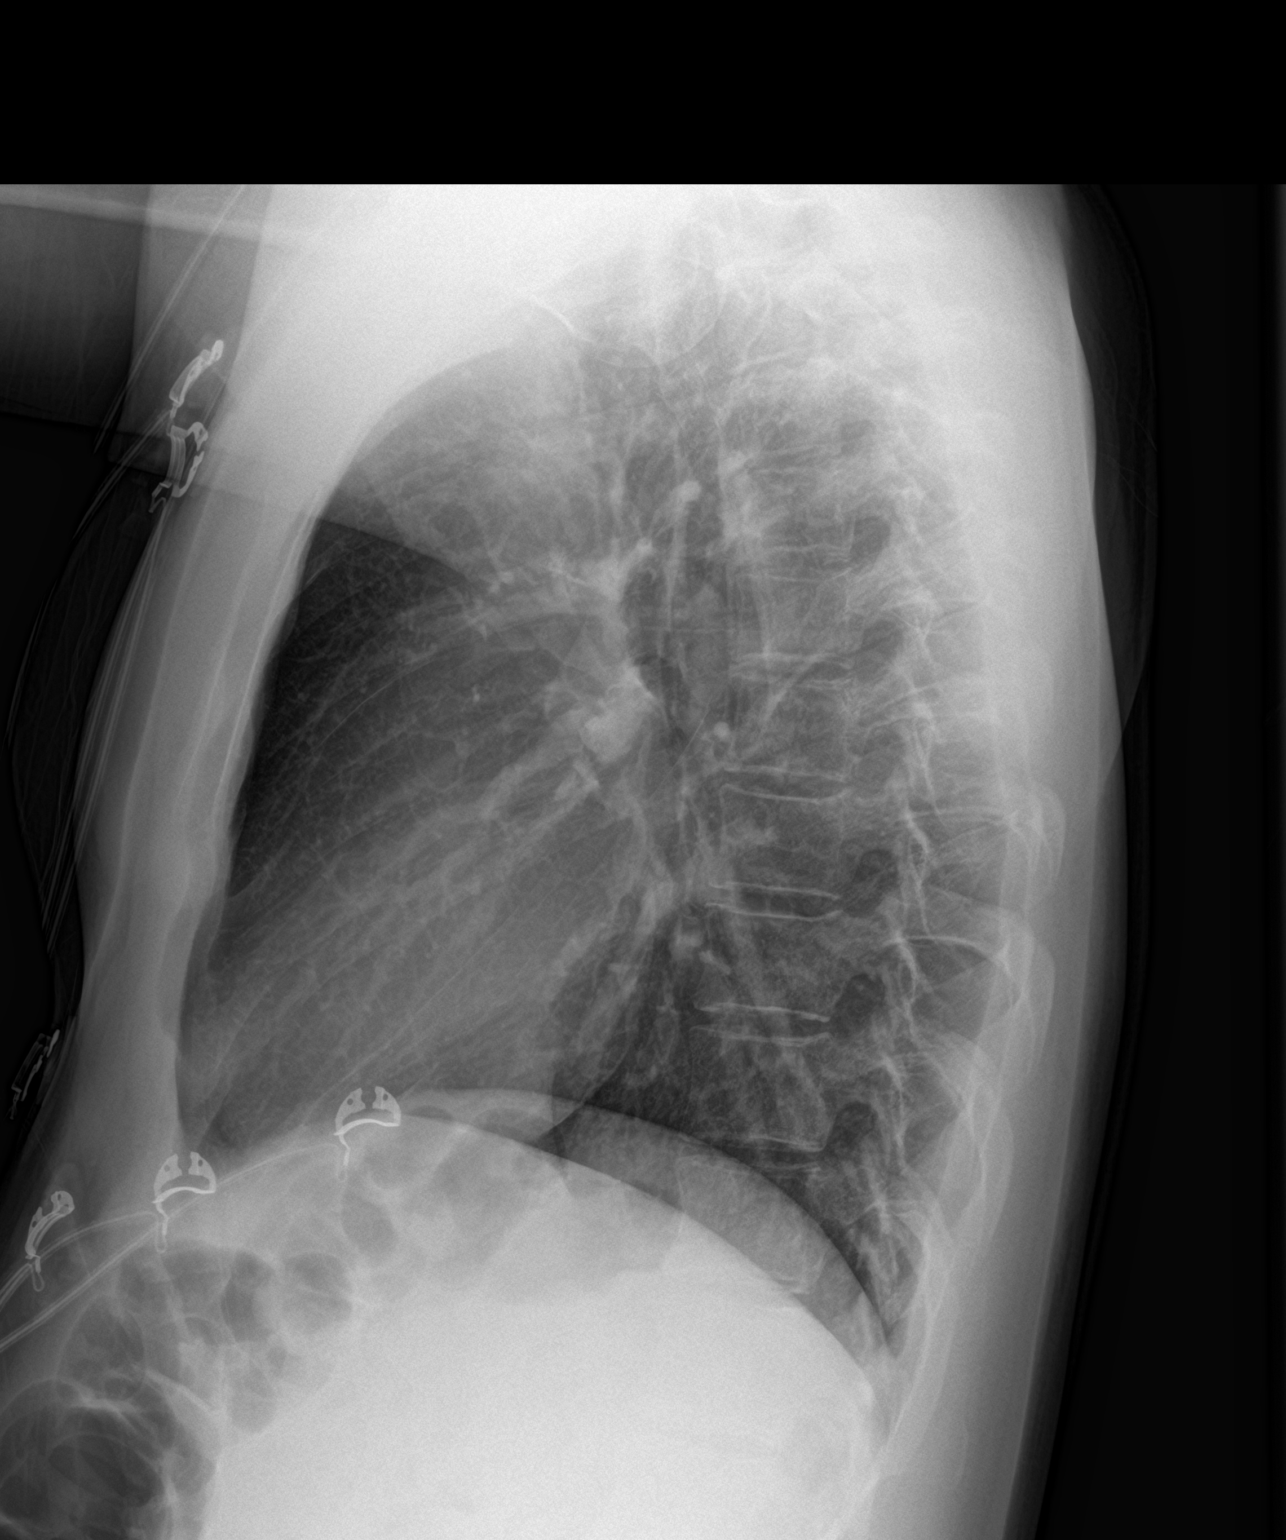

[2 of 2 positions shown; findings below may reference images not displayed]

FINDINGS: Cardiomediastinal contours and hilar structures are normal.

Lungs are clear.

No pleural effusion.

Visualized skeletal structures are unremarkable.
IMPRESSION: No acute cardiopulmonary disease.

## 2021-12-08 ENCOUNTER — Other Ambulatory Visit: Payer: Self-pay

## 2021-12-08 ENCOUNTER — Ambulatory Visit: Payer: 59 | Admitting: Physician Assistant

## 2021-12-08 ENCOUNTER — Encounter: Payer: Self-pay | Admitting: Physician Assistant

## 2021-12-08 VITALS — BP 125/79 | HR 72 | Temp 97.6°F | Wt 157.0 lb

## 2021-12-08 DIAGNOSIS — J029 Acute pharyngitis, unspecified: Secondary | ICD-10-CM

## 2021-12-08 LAB — POCT RAPID STREP A (OFFICE): Rapid Strep A Screen: NEGATIVE

## 2021-12-08 MED ORDER — AMOXICILLIN 500 MG PO CAPS: 500.0000 mg | ORAL_CAPSULE | Freq: Two times a day (BID) | ORAL | 0 refills | Status: AC

## 2021-12-08 NOTE — Progress Notes (Signed)
?  ? ? ?I,Shelsey Rieth Robinson,acting as a Education administrator for [employer], PA-C.,have documented all relevant documentation on the behalf of Mardene Speak, PA-C,as directed by  [employer], PA-C while in the presence of [employer], PA-C. ? ?Established patient visit ? ? ?Patient: Steven Sanchez   DOB: March 04, 1986   36 y.o. Male  MRN: [id number] ?Visit Date: 12/08/2021 ? ?Today's healthcare provider: Mardene Speak, PA-C  ? ?Chief Complaint  ?Patient presents with  ? Sore Throat  ? ?Subjective  ?  ?Sore Throat  ?This is a new problem. The current episode started yesterday. The problem has been gradually worsening. Sore throat worse side: middle of throat. There has been no fever. The pain is moderate. Associated symptoms include coughing, diarrhea, a hoarse voice and trouble swallowing. Pertinent negatives include no abdominal pain, congestion, drooling, ear discharge, ear pain, headaches, plugged ear sensation, neck pain, shortness of breath, stridor, swollen glands or vomiting. He has had exposure to strep. He has tried NSAIDs for the symptoms. The treatment provided mild relief.   ?Daughter dx with strep yesterday.  ? ?Medications: ?Outpatient Medications Prior to Visit  ?Medication Sig  ? pantoprazole (PROTONIX) 40 MG tablet Take 1 tablet (40 mg total) by mouth daily.  ? valACYclovir (VALTREX) 500 MG tablet Take 500 mg by mouth 2 (two) times daily.  ? ?No facility-administered medications prior to visit.  ? ? ?Review of Systems  ?HENT:  Positive for hoarse voice and trouble swallowing. Negative for congestion, drooling, ear discharge and ear pain.   ?Respiratory:  Positive for cough. Negative for shortness of breath and stridor.   ?Gastrointestinal:  Positive for diarrhea. Negative for abdominal pain and vomiting.  ?Musculoskeletal:  Negative for neck pain.  ?Neurological:  Negative for headaches.  ?See HPI ? ?  Objective  ?  ?BP 125/79 (BP Location: Right Arm, Patient Position: Sitting, Cuff Size: Normal)   Pulse  72   Temp 97.6 ?F (36.4 ?C) (Oral)   Wt 157 lb (71.2 kg)   SpO2 100%   BMI 24.59 kg/m?  ? ? ?Physical Exam ?Vitals and nursing note reviewed.  ?Constitutional:   ?   General: He is in acute distress.  ?   Appearance: He is well-developed and normal weight.  ?HENT:  ?   Head: Normocephalic and atraumatic.  ?   Right Ear: Tympanic membrane normal.  ?   Left Ear: Tympanic membrane normal.  ?   Nose: Congestion present. No rhinorrhea.  ?   Mouth/Throat:  ?   Mouth: No oral lesions.  ?   Pharynx: Posterior oropharyngeal erythema present. No oropharyngeal exudate.  ?Cardiovascular:  ?   Rate and Rhythm: Normal rate and regular rhythm.  ?   Heart sounds: Normal heart sounds.  ?Pulmonary:  ?   Effort: Pulmonary effort is normal.  ?   Breath sounds: Normal breath sounds.  ?Musculoskeletal:  ?   Cervical back: Normal range of motion.  ?Lymphadenopathy:  ?   Cervical: Cervical adenopathy present.  ?Neurological:  ?   Mental Status: He is alert.  ?  ? ? ?No results found for any visits on 12/08/21. ? Assessment & Plan  ?  ? ?1. Sore throat ?Patient advised to continue with symptomatic tx: honey with tea, plenty of drinks, OTC pain medications, lozenges, warm salt water, nasal saline rinse ?- POCT rapid strep A was negative ?If symptoms worsen, patient could start on antibiotic: ?- amoxicillin (AMOXIL) 500 MG capsule; Take 1 capsule (500 mg total) by mouth  2 (two) times daily for 10 days.  Dispense: 20 capsule; Refill: 0 ?Patient was instructed not to fill it unless ?their symptoms worsen. Discussed the potential negative side effects of antibiotics and that they can lead to resistance if used unnecessarily ?Discussed expectations for duration of symptoms. Discussed that they may feel bad for up to a week, but sometimes symptoms last longer .  Explained that you can get multiple viruses in a row and recommend ?prevention strategies such as hand washing ?Made a plan with the patient to outline what to do if symptoms worsen or  do not improve, or if they develop concerning symptoms like high fever, SOB  ? ?CPE in 39mo ?  ? ?The patient was advised to call back or seek an in-person evaluation if the symptoms worsen or if the condition fails to improve as anticipated. ? ?I discussed the assessment and treatment plan with the patient. The patient was provided an opportunity to ask questions and all were answered. The patient agreed with the plan and demonstrated an understanding of the instructions. ? ?The entirety of the information documented in the History of Present Illness, Review of Systems and Physical Exam were personally obtained by me. Portions of this information were initially documented by the CMA and reviewed by me for thoroughness and accuracy.  ? ? ? ?Mardene Speak, PA-C  ?[place] ?[phone] (phone) ?[phone] (fax) ? ?[facility] ?

## 2021-12-14 NOTE — Progress Notes (Signed)
?  ? ? ?I,Demica Zook Robinson,acting as a Education administrator for [employer], PA-C.,have documented all relevant documentation on the behalf of Mardene Speak, PA-C,as directed by  [employer], PA-C while in the presence of [employer], PA-C. ? ?Established patient visit ? ? ?Patient: Steven Sanchez   DOB: July 29, 1986   36 y.o. Male  MRN: [id number] ?Visit Date: 12/16/2021 ? ?Today's healthcare provider: Mardene Speak, PA-C  ? ?CC: Patient requesting STD testing.  ?Subjective  ?  ? ?HPI   ?STD testing  ?Last edited by Alanson Puls, CMA on 12/16/2021  8:09 AM.  ?  ?  ?Patient reports partner dx with Dalbert Batman recently and wants STD panel.  Reports bumps on penis. Reports they are itching, red, swelling x months and dx with herpes.  Treats with valacyclovir.  Requesting refill today.  Denies having "bumps" now. Denies having penile discharge and urinary symptoms ?Reports having an acid reflux and asks if he needs to continue to take OTC PPI or if there is a better medication for GERD. ? ? ?Medications: ?Outpatient Medications Prior to Visit  ?Medication Sig  ? pantoprazole (PROTONIX) 40 MG tablet Take 1 tablet (40 mg total) by mouth daily.  ? valACYclovir (VALTREX) 500 MG tablet Take 500 mg by mouth 2 (two) times daily.  ? amoxicillin (AMOXIL) 500 MG capsule Take 1 capsule (500 mg total) by mouth 2 (two) times daily for 10 days. (Patient not taking: Reported on 12/16/2021)  ? ?No facility-administered medications prior to visit.  ? ? ?Review of Systems  ?Constitutional: Negative.   ?Respiratory: Negative.    ?Cardiovascular: Negative.   ?Gastrointestinal: Negative.   ? ? ?  Objective  ?  ?BP 128/76 (BP Location: Right Arm, Patient Position: Sitting, Cuff Size: Normal)   Pulse 67   Temp 97.7 ?F (36.5 ?C) (Oral)   Resp 16   Wt 158 lb 9.6 oz (71.9 kg)   SpO2 98%   BMI 24.84 kg/m?  ? ? ?Physical Exam ?Vitals and nursing note reviewed.  ?Constitutional:   ?   Appearance: Normal appearance.  ?HENT:  ?   Head: Normocephalic and  atraumatic.  ?Cardiovascular:  ?   Rate and Rhythm: Normal rate and regular rhythm.  ?   Pulses: Normal pulses.  ?   Heart sounds: Normal heart sounds.  ?Pulmonary:  ?   Effort: Pulmonary effort is normal.  ?   Breath sounds: Normal breath sounds.  ?Abdominal:  ?   General: Abdomen is flat. Bowel sounds are normal.  ?   Palpations: Abdomen is soft.  ?Neurological:  ?   Mental Status: He is alert and oriented to person, place, and time.  ?Psychiatric:     ?   Thought Content: Thought content normal.     ?   Judgment: Judgment normal.  ?  ? ?No results found for any visits on 12/16/21. ? Assessment & Plan  ?  ? ?1. Exposure to sexually transmitted disease (STD) ?- POCT urinalysis dipstick ?- Urine cytology ancillary only ?- metroNIDAZOLE (FLAGYL) 500 MG tablet; Take 1 tablet (500 mg total) by mouth 2 (two) times daily for 7 days.  Dispense: 14 tablet; Refill: 0 ?- Urinalysis, microscopic only ?- HIV antibody (with reflex) ? ?2. Gastroesophageal reflux disease without esophagitis ?Continue PPI ?Elevate the head of the bed 6-8 inches, avoid recumbency for 3 hours after eating, avoid food as a delayed gastric emptying, weight loss   ? ?3. Herpes genitalis in men ?Advised preventive treatment of Herpes ?Takes  it daily ?- valACYclovir (VALTREX) 500 MG tablet; Take 1 tablet (500 mg total) by mouth 2 (two) times daily.  Dispense: 14 tablet; Refill: 0 ? ?CPE and recheck in 1 mo ? ?The patient was advised to call back or seek an in-person evaluation if the symptoms worsen or if the condition fails to improve as anticipated. ? ?I discussed the assessment and treatment plan with the patient. The patient was provided an opportunity to ask questions and all were answered. The patient agreed with the plan and demonstrated an understanding of the instructions. ? ?The entirety of the information documented in the History of Present Illness, Review of Systems and Physical Exam were personally obtained by me. Portions of this  information were initially documented by the CMA and reviewed by me for thoroughness and accuracy.   ?Portions of this note were created using dictation software and may contain typographical errors.   ? ?Mardene Speak, PA-C  ?[place] ?[phone] (phone) ?[phone] (fax) ? ?[facility] ?

## 2021-12-16 ENCOUNTER — Ambulatory Visit: Payer: 59 | Admitting: Physician Assistant

## 2021-12-16 ENCOUNTER — Encounter: Payer: Self-pay | Admitting: Physician Assistant

## 2021-12-16 ENCOUNTER — Other Ambulatory Visit (HOSPITAL_COMMUNITY)
Admission: RE | Admit: 2021-12-16 | Discharge: 2021-12-16 | Disposition: A | Discharge status: Home / Self Care | Payer: 59 | Source: Ambulatory Visit | Attending: Physician Assistant | Admitting: Physician Assistant

## 2021-12-16 VITALS — BP 128/76 | HR 67 | Temp 97.7°F | Resp 16 | Wt 158.6 lb

## 2021-12-16 DIAGNOSIS — Z202 Contact with and (suspected) exposure to infections with a predominantly sexual mode of transmission: Secondary | ICD-10-CM | POA: Diagnosis present

## 2021-12-16 DIAGNOSIS — K219 Gastro-esophageal reflux disease without esophagitis: Secondary | ICD-10-CM

## 2021-12-16 DIAGNOSIS — A6002 Herpesviral infection of other male genital organs: Secondary | ICD-10-CM | POA: Diagnosis not present

## 2021-12-16 DIAGNOSIS — R369 Urethral discharge, unspecified: Secondary | ICD-10-CM

## 2021-12-16 DIAGNOSIS — R300 Dysuria: Secondary | ICD-10-CM

## 2021-12-16 LAB — POCT URINALYSIS DIPSTICK
Bilirubin, UA: NEGATIVE
Blood, UA: NEGATIVE
Glucose, UA: NEGATIVE
Ketones, UA: NEGATIVE
Nitrite, UA: NEGATIVE
Protein, UA: NEGATIVE
Spec Grav, UA: 1.025 (ref 1.010–1.025)
Urobilinogen, UA: 0.2 E.U./dL
pH, UA: 5 (ref 5.0–8.0)

## 2021-12-16 MED ORDER — VALACYCLOVIR HCL 500 MG PO TABS: 500.0000 mg | ORAL_TABLET | Freq: Two times a day (BID) | ORAL | 0 refills | Status: AC

## 2021-12-16 MED ORDER — METRONIDAZOLE 500 MG PO TABS: 500.0000 mg | ORAL_TABLET | Freq: Two times a day (BID) | ORAL | 0 refills | Status: AC

## 2021-12-20 LAB — URINE CYTOLOGY ANCILLARY ONLY
Candida Urine: NEGATIVE
Chlamydia: NEGATIVE
Comment: NEGATIVE
Comment: NEGATIVE
Comment: NORMAL
Neisseria Gonorrhea: NEGATIVE
Trichomonas: NEGATIVE

## 2022-01-12 NOTE — Progress Notes (Signed)
I,Lakeisa Heninger Robinson,acting as a Neurosurgeon for [employer], PA-C.,have documented all relevant documentation on the behalf of Debera Lat, PA-C,as directed by  [employer], PA-C while in the presence of [employer], PA-C.  Complete physical exam   Patient: Steven Sanchez   DOB: [birth year]   36 y.o. Male  MRN: [id number] Visit Date: 01/13/2022  Today's healthcare provider: Debera Lat, PA-C   Chief Complaint  Patient presents with   Annual Exam   Subjective    Steven Sanchez is a 36 y.o. male who presents today for a complete physical exam.  He reports consuming a general diet. Home exercise routine includes push up, abs, curles. He generally feels well. He reports sleeping well. He does not have additional problems to discuss today.    No past medical history on file. Past Surgical History:  Procedure Laterality Date   ESOPHAGOGASTRODUODENOSCOPY (EGD) WITH PROPOFOL N/A 08/03/2020   Procedure: ESOPHAGOGASTRODUODENOSCOPY (EGD) WITH PROPOFOL;  Surgeon: Midge Minium, MD;  Location: [facility] ENDOSCOPY;  Service: Endoscopy;  Laterality: N/A;   Social History   Socioeconomic History   Marital status: Married    Spouse name: Not on file   Number of children: Not on file   Years of education: Not on file   Highest education level: Not on file  Occupational History   Occupation: Tennis pro  Tobacco Use   Smoking status: Former    Types: Cigarettes    Quit date: 12/11/2019    Years since quitting: 2.0   Smokeless tobacco: Never  Vaping Use   Vaping Use: Never used  Substance and Sexual Activity   Alcohol use: Yes    Alcohol/week: 5.0 standard drinks    Types: 5 Cans of beer per week    Comment: occasional   Drug use: No   Sexual activity: Yes  Other Topics Concern   Not on file  Social History Narrative   Lives with wife and one step-daughter   Social Determinants of Health   Financial Resource Strain: Not on file  Food Insecurity: Not on file   Transportation Needs: Not on file  Physical Activity: Not on file  Stress: Not on file  Social Connections: Not on file  Intimate Partner Violence: Not on file   Family Status  Relation Name Status   Mother  Alive   Father  Alive   Brother  Alive   MGM  Deceased   MGF  Deceased   PGM  Deceased   PGF  Deceased   Family History  Problem Relation Age of Onset   Hypertension Father    No Known Allergies  Patient Care Team: Talmo, Alessandra Bevels, PA-C as PCP - General (Family Medicine) Iran Ouch, MD as PCP - Cardiology (Cardiology)   Medications: Outpatient Medications Prior to Visit  Medication Sig   pantoprazole (PROTONIX) 40 MG tablet Take 1 tablet (40 mg total) by mouth daily.   valACYclovir (VALTREX) 500 MG tablet Take 1 tablet (500 mg total) by mouth 2 (two) times daily.   No facility-administered medications prior to visit.    Review of Systems  Allergic/Immunologic: Positive for environmental allergies.  All other systems reviewed and are negative.    Objective     BP 122/75 (BP Location: Left Arm, Patient Position: Sitting, Cuff Size: Normal)   Pulse 63   Temp 97.6 F (36.4 C) (Oral)   Resp 16   Wt 157 lb 12.8 oz (71.6 kg)   SpO2 98%   BMI  24.71 kg/m     Physical Exam Vitals reviewed.  Constitutional:      General: He is not in acute distress.    Appearance: Normal appearance. He is well-developed. He is not diaphoretic.  HENT:     Head: Normocephalic and atraumatic.     Right Ear: Tympanic membrane, ear canal and external ear normal.     Left Ear: Tympanic membrane, ear canal and external ear normal.     Nose: Nose normal.     Mouth/Throat:     Mouth: Mucous membranes are moist.     Pharynx: Oropharynx is clear. No oropharyngeal exudate.  Eyes:     General: No scleral icterus.    Conjunctiva/sclera: Conjunctivae normal.     Pupils: Pupils are equal, round, and reactive to light.  Neck:     Thyroid: No thyromegaly.  Cardiovascular:      Rate and Rhythm: Normal rate and regular rhythm.     Pulses: Normal pulses.     Heart sounds: Normal heart sounds. No murmur heard. Pulmonary:     Effort: Pulmonary effort is normal. No respiratory distress.     Breath sounds: Normal breath sounds. No wheezing or rales.  Abdominal:     General: There is no distension.     Palpations: Abdomen is soft.     Tenderness: There is no abdominal tenderness.  Musculoskeletal:        General: No deformity.     Cervical back: Neck supple.     Right lower leg: No edema.     Left lower leg: No edema.  Lymphadenopathy:     Cervical: No cervical adenopathy.  Skin:    General: Skin is warm and dry.     Findings: No rash.  Neurological:     Mental Status: He is alert and oriented to person, place, and time. Mental status is at baseline.     Sensory: No sensory deficit.     Motor: No weakness.     Gait: Gait normal.  Psychiatric:        Mood and Affect: Mood normal.        Behavior: Behavior normal.        Thought Content: Thought content normal.      Last depression screening scores    01/13/2022   10:03 AM 12/08/2021    8:12 AM 02/20/2020    7:43 AM  PHQ 2/9 Scores  PHQ - 2 Score 0 0 0  PHQ- 9 Score 0 0    Last fall risk screening    01/13/2022   10:03 AM  Fall Risk   Falls in the past year? 0  Number falls in past yr: 0  Injury with Fall? 0  Follow up Falls evaluation completed   Last Audit-C alcohol use screening    01/13/2022   10:04 AM  Alcohol Use Disorder Test (AUDIT)  1. How often do you have a drink containing alcohol? 3  2. How many drinks containing alcohol do you have on a typical day when you are drinking? 0  3. How often do you have six or more drinks on one occasion? 0  AUDIT-C Score 3   A score of 3 or more in women, and 4 or more in men indicates increased risk for alcohol abuse, EXCEPT if all of the points are from question 1   No results found for any visits on 01/13/22.  Assessment & Plan     Routine Health Maintenance and Physical Exam  Exercise Activities and Dietary recommendations  Goals   None      There is no immunization history on file for this patient.  Health Maintenance  Topic Date Due   COVID-19 Vaccine (1) Never done   Hepatitis C Screening  Never done   INFLUENZA VACCINE  03/21/2022   TETANUS/TDAP  10/19/2024   HIV Screening  Completed   HPV VACCINES  Aged Out    Discussed health benefits of physical activity, and encouraged him to engage in regular exercise appropriate for his age and condition. 1. Annual physical exam  - CBC with Differential/Platelet - Comprehensive metabolic panel  2. Gastroesophageal reflux disease without esophagitis Well controlled on lifestyle modifications  3. COVID-19 vaccination declined  4. Need for hepatitis C screening test  - Hepatitis C Antibody  5. Hx of Herpes genitalis in men Has no issues currently  6. Hx of exposure to STD Completed a course of abx for trichomonas   CPE in 1 year    The patient was advised to call back or seek an in-person evaluation if the symptoms worsen or if the condition fails to improve as anticipated.  I discussed the assessment and treatment plan with the patient. The patient was provided an opportunity to ask questions and all were answered. The patient agreed with the plan and demonstrated an understanding of the instructions.  The entirety of the information documented in the History of Present Illness, Review of Systems and Physical Exam were personally obtained by me. Portions of this information were initially documented by the CMA and reviewed by me for thoroughness and accuracy.  Portions of this note were created using dictation software and may contain typographical errors.     Debera Lat, PA-C  [facility] [phone] (phone) [phone] (fax)  [facility]

## 2022-01-13 ENCOUNTER — Ambulatory Visit (INDEPENDENT_AMBULATORY_CARE_PROVIDER_SITE_OTHER): Payer: 59 | Admitting: Physician Assistant

## 2022-01-13 ENCOUNTER — Encounter: Payer: Self-pay | Admitting: Physician Assistant

## 2022-01-13 VITALS — BP 122/75 | HR 63 | Temp 97.6°F | Resp 16 | Wt 157.8 lb

## 2022-01-13 DIAGNOSIS — Z0000 Encounter for general adult medical examination without abnormal findings: Secondary | ICD-10-CM | POA: Diagnosis not present

## 2022-01-13 DIAGNOSIS — Z202 Contact with and (suspected) exposure to infections with a predominantly sexual mode of transmission: Secondary | ICD-10-CM | POA: Insufficient documentation

## 2022-01-13 DIAGNOSIS — K219 Gastro-esophageal reflux disease without esophagitis: Secondary | ICD-10-CM

## 2022-01-13 DIAGNOSIS — Z1159 Encounter for screening for other viral diseases: Secondary | ICD-10-CM | POA: Diagnosis not present

## 2022-01-13 DIAGNOSIS — E049 Nontoxic goiter, unspecified: Secondary | ICD-10-CM

## 2022-01-13 DIAGNOSIS — A6002 Herpesviral infection of other male genital organs: Secondary | ICD-10-CM

## 2022-01-13 DIAGNOSIS — Z2821 Immunization not carried out because of patient refusal: Secondary | ICD-10-CM | POA: Insufficient documentation

## 2022-01-14 LAB — COMPREHENSIVE METABOLIC PANEL
ALT: 14 IU/L (ref 0–44)
AST: 20 IU/L (ref 0–40)
Albumin/Globulin Ratio: 2.2 (ref 1.2–2.2)
Albumin: 4.7 g/dL (ref 4.0–5.0)
Alkaline Phosphatase: 60 IU/L (ref 44–121)
BUN/Creatinine Ratio: 12 (ref 9–20)
BUN: 13 mg/dL (ref 6–20)
Bilirubin Total: 0.6 mg/dL (ref 0.0–1.2)
CO2: 23 mmol/L (ref 20–29)
Calcium: 9.5 mg/dL (ref 8.7–10.2)
Chloride: 103 mmol/L (ref 96–106)
Creatinine, Ser: 1.05 mg/dL (ref 0.76–1.27)
Globulin, Total: 2.1 g/dL (ref 1.5–4.5)
Glucose: 78 mg/dL (ref 70–99)
Potassium: 4 mmol/L (ref 3.5–5.2)
Sodium: 141 mmol/L (ref 134–144)
Total Protein: 6.8 g/dL (ref 6.0–8.5)
eGFR: 96 mL/min/{1.73_m2} (ref >59)

## 2022-01-14 LAB — CBC WITH DIFFERENTIAL/PLATELET
Basophils Absolute: 0 10*3/uL (ref 0.0–0.2)
Basos: 1 %
EOS (ABSOLUTE): 0.1 10*3/uL (ref 0.0–0.4)
Eos: 1 %
Hematocrit: 42.2 % (ref 37.5–51.0)
Hemoglobin: 14.5 g/dL (ref 13.0–17.7)
Immature Grans (Abs): 0 10*3/uL (ref 0.0–0.1)
Immature Granulocytes: 0 %
Lymphocytes Absolute: 1.3 10*3/uL (ref 0.7–3.1)
Lymphs: 26 %
MCH: 30.1 pg (ref 26.6–33.0)
MCHC: 34.4 g/dL (ref 31.5–35.7)
MCV: 88 fL (ref 79–97)
Monocytes Absolute: 0.7 10*3/uL (ref 0.1–0.9)
Monocytes: 14 %
Neutrophils Absolute: 2.8 10*3/uL (ref 1.4–7.0)
Neutrophils: 58 %
Platelets: 211 10*3/uL (ref 150–450)
RBC: 4.82 x10E6/uL (ref 4.14–5.80)
RDW: 12 % (ref 11.6–15.4)
WBC: 4.8 10*3/uL (ref 3.4–10.8)

## 2022-01-14 LAB — HEPATITIS C ANTIBODY: Hep C Virus Ab: NONREACTIVE

## 2022-03-06 ENCOUNTER — Ambulatory Visit: Payer: Self-pay

## 2022-03-06 NOTE — Telephone Encounter (Signed)
  Chief Complaint: Abdominal Pain Symptoms: Sharp abdominal pain, chills, Loss of appetite, chills and low grade fever, 100.2 Frequency: this morning Pertinent Negatives: Patient denies Vomiting or diarrhea Disposition: [] ED /[x] Urgent Care (no appt availability in office) / [] Appointment(In office/virtual)/ []  [facility]/ [] Home Care/ [] Refused Recommended Disposition /[] [facility] [facility]/ []  Follow-up with PCP Additional Notes: Pt returned from a weekend away with abdominal pain, loss of appetite, and chills today. Pt took a COVID test which was negative.  Reason for Disposition  [1] MILD-MODERATE pain AND [2] constant AND [3] present > 2 hours  Protocols used: Abdominal Pain - Male-A-AH

## 2022-03-06 NOTE — Telephone Encounter (Signed)
FYI

## 2022-03-07 ENCOUNTER — Encounter: Payer: Self-pay | Admitting: Physician Assistant

## 2022-03-08 ENCOUNTER — Ambulatory Visit
Admission: RE | Admit: 2022-03-08 | Discharge: 2022-03-08 | Disposition: A | Discharge status: Home / Self Care | Payer: 59 | Source: Ambulatory Visit | Attending: Internal Medicine | Admitting: Internal Medicine

## 2022-03-08 ENCOUNTER — Ambulatory Visit: Payer: Self-pay | Admitting: *Deleted

## 2022-03-08 ENCOUNTER — Other Ambulatory Visit: Payer: Self-pay | Admitting: Internal Medicine

## 2022-03-08 DIAGNOSIS — R1084 Generalized abdominal pain: Secondary | ICD-10-CM

## 2022-03-08 MED ORDER — IOHEXOL 300 MG/ML  SOLN
100.0000 mL | Freq: Once | INTRAMUSCULAR | Status: AC | PRN
  Administered 2022-03-08: 100 mL via INTRAVENOUS

## 2022-03-08 NOTE — Telephone Encounter (Signed)
  Chief Complaint: Diarrhea Symptoms: Watery diarrhea "Constant" nausea, one episode of vomiting. Fever 101.0 . Sharp abdominal pain, intermittent, different areas.Urine darker than usual. Frequency: 2 days Pertinent Negatives: Patient denies dizziness Disposition: [] ED /[x] Urgent Care (no appt availability in office) / [] Appointment(In office/virtual)/ []  [facility]/ [] Home Care/ [] Refused Recommended Disposition /[] [place] [facility]/ []  Follow-up with PCP Additional Notes: Pt went to [facility] Monday. Neg strep and Covid. [place] this W/E. States placed on ATB "Just in case."  No availability at practice, consulted with , no appts today. Pt advised UC. Care advise provided, verbalizes understanding. Reason for Disposition  Fever > 101 F (38.3 C)  Answer Assessment - Initial Assessment Questions 1. DIARRHEA SEVERITY: "How bad is the diarrhea?" "How many more stools have you had in the past 24 hours than normal?"    - NO DIARRHEA (SCALE 0)   - MILD (SCALE 1-3): Few loose or mushy BMs; increase of 1-3 stools over normal daily number of stools; mild increase in ostomy output.   -  MODERATE (SCALE 4-7): Increase of 4-6 stools daily over normal; moderate increase in ostomy output.   -  SEVERE (SCALE 8-10; OR "WORST POSSIBLE"): Increase of 7 or more stools daily over normal; moderate increase in ostomy output; incontinence.     "Constant" 2. ONSET: "When did the diarrhea begin?"      Yesterday 3. BM CONSISTENCY: "How loose or watery is the diarrhea?"      Watery 4. VOMITING: "Are you also vomiting?" If Yes, ask: "How many times in the past 24 hours?"      1 episode last night 5. ABDOMEN PAIN: "Are you having any abdomen pain?" If Yes, ask: "What does it feel like?" (e.g., crampy, dull, intermittent, constant)      Sharp pain at times, "Travels" 6. ABDOMEN PAIN SEVERITY: If present, ask: "How bad is the pain?"  (e.g., Scale 1-10; mild, moderate, or  severe)   - MILD (1-3): doesn't interfere with normal activities, abdomen soft and not tender to touch    - MODERATE (4-7): interferes with normal activities or awakens from sleep, abdomen tender to touch    - SEVERE (8-10): excruciating pain, doubled over, unable to do any normal activities       Intermittent, shooting at times 7. ORAL INTAKE: If vomiting, "Have you been able to drink liquids?" "How much liquids have you had in the past 24 hours?"     Trying to stay hydrated 8. HYDRATION: "Any signs of dehydration?" (e.g., dry mouth [not just dry lips], too weak to stand, dizziness, new weight loss) "When did you last urinate?"     Urine little darker than usual. 9. EXPOSURE: "Have you traveled to a foreign country recently?" "Have you been exposed to anyone with diarrhea?" "Could you have eaten any food that was spoiled?"     no 10. ANTIBIOTIC USE: "Are you taking antibiotics now or have you taken antibiotics in the past 2 months?"       Started yesterday 11. OTHER SYMPTOMS: "Do you have any other symptoms?" (e.g., fever, blood in stool)       Temp 101.0  Protocols used: Diarrhea-A-AH

## 2022-03-08 NOTE — Telephone Encounter (Signed)
Attempted to return call.   Left voicemail to call back to discuss diarrhea and nausea symptoms with a nurse.

## 2022-03-08 NOTE — Telephone Encounter (Signed)
Message from Carleene Overlie sent at 03/08/2022  8:20 AM EDT  Summary: nausea/ diarrhea   Patient states he was seen at [facility] for chills and fever   Patient states he know has nausea and diarrhea x1d   Please fu w/ patient           Call History   Type Contact Phone/Fax User  03/08/2022 08:17 AM EDT Phone (Incoming) Aron Baba (Self) [phone] ([address]) [place], [place]

## 2022-03-08 NOTE — Telephone Encounter (Signed)
FYI

## 2022-09-28 ENCOUNTER — Ambulatory Visit: Payer: BC Managed Care – PPO | Admitting: Physician Assistant

## 2022-09-28 VITALS — BP 129/89 | HR 65 | Temp 98.3°F | Wt 162.0 lb

## 2022-09-28 DIAGNOSIS — R0600 Dyspnea, unspecified: Secondary | ICD-10-CM

## 2022-09-28 DIAGNOSIS — J329 Chronic sinusitis, unspecified: Secondary | ICD-10-CM | POA: Diagnosis not present

## 2022-09-28 MED ORDER — ALBUTEROL SULFATE HFA 108 (90 BASE) MCG/ACT IN AERS: 2.0000 | INHALATION_SPRAY | Freq: Four times a day (QID) | RESPIRATORY_TRACT | 2 refills | Status: AC | PRN

## 2022-09-28 NOTE — Progress Notes (Signed)
Argentina Ponder DeSanto,acting as a Education administrator for [employer], PA-C.,have documented all relevant documentation on the behalf of Mardene Speak, PA-C,as directed by  [employer], PA-C while in the presence of [employer], PA-C.     Established patient visit   Patient: Steven Sanchez   DOB: [birth year]   37 y.o. Male  MRN: [id number] Visit Date: 09/28/2022  Today's healthcare provider: Mardene Speak, PA-C   CC: SOB since having respiratory infection at Christmas  Subjective    HPI  Patient is a 37 year old male who presents with complaint of continued shortness of breath since having respiratory infection at Christmas.  He state he has taken 2 rounds of antibiotics and 1 round of Prednisone. He was not put on any inhaler.  He describes his SOB as having to take an extra breath and feels he can't take a deep enough breath as he wants.  He feels when he takes a deep breath he wheezes.  Taking a deep breath also causes him to cough.  His cough is non productive.  He feels that his chest is a little tight. Former smoker, "more a social type of smoker x 10 years" Medications: Outpatient Medications Prior to Visit  Medication Sig   pantoprazole (PROTONIX) 40 MG tablet Take 1 tablet (40 mg total) by mouth daily.   valACYclovir (VALTREX) 500 MG tablet Take 1 tablet (500 mg total) by mouth 2 (two) times daily.   No facility-administered medications prior to visit.    Review of Systems  Constitutional:  Negative for chills, diaphoresis, fatigue and fever.  HENT:  Positive for postnasal drip and sinus pressure. Negative for congestion, ear discharge, ear pain, facial swelling, nosebleeds, rhinorrhea, sinus pain, sneezing, sore throat, tinnitus and trouble swallowing.   Respiratory:  Positive for cough, chest tightness, shortness of breath and wheezing.   Cardiovascular:  Negative for chest pain, palpitations and leg swelling.  Gastrointestinal:  Negative for abdominal pain.        Objective    BP 129/89 (BP Location: Right Arm, Patient Position: Sitting, Cuff Size: Normal)   Pulse 65   Temp 98.3 F (36.8 C) (Oral)   Wt 162 lb (73.5 kg)   SpO2 100%   BMI 25.37 kg/m    Physical Exam Vitals reviewed.  Constitutional:      General: He is in acute distress.     Appearance: Normal appearance. He is not ill-appearing, toxic-appearing or diaphoretic.  HENT:     Head: Normocephalic and atraumatic.     Right Ear: There is impacted cerumen.     Left Ear: Tympanic membrane, ear canal and external ear normal.     Nose: Congestion and rhinorrhea present.     Mouth/Throat:     Pharynx: Posterior oropharyngeal erythema present.  Eyes:     General: No scleral icterus.       Right eye: No discharge.        Left eye: No discharge.     Extraocular Movements: Extraocular movements intact.     Conjunctiva/sclera: Conjunctivae normal.     Pupils: Pupils are equal, round, and reactive to light.  Cardiovascular:     Rate and Rhythm: Normal rate and regular rhythm.     Pulses: Normal pulses.     Heart sounds: Normal heart sounds. No murmur heard. Pulmonary:     Effort: Pulmonary effort is normal. No respiratory distress.     Breath sounds: Normal breath sounds. No wheezing or rhonchi.  Abdominal:  General: Abdomen is flat. Bowel sounds are normal.     Palpations: Abdomen is soft.  Musculoskeletal:        General: Normal range of motion.     Cervical back: Normal range of motion and neck supple.     Right lower leg: No edema.     Left lower leg: No edema.  Lymphadenopathy:     Cervical: No cervical adenopathy.  Skin:    General: Skin is warm and dry.     Findings: No rash.  Neurological:     General: No focal deficit present.     Mental Status: He is alert and oriented to person, place, and time. Mental status is at baseline.  Psychiatric:        Behavior: Behavior normal.        Thought Content: Thought content normal.     No results found for any visits  on 09/28/22.  Assessment & Plan     1. Dyspnea, unspecified type Most likely Due to rhinosinusitis Counseled that it could be attributed to his hx of allergic rhinitis vs asthma vs copd Social smoker Normal lung PE and vitals Symptomatic Treatment advised: see below If still present, start: - albuterol (VENTOLIN HFA) 108 (90 Base) MCG/ACT inhaler; INHALE 2 PUFFS INTO THE LUNGS THREE TIMES DAILY AS NEEDED FOR WHEEZING OR SHORTNESS OF BREATH  Dispense: 6.7 g; Refill: 0 Recommended liquid and healthy diet. Works outside: advised a Scientist, research (medical)  Will FU PRN The patient was advised to call back or seek an in-person evaluation if the symptoms worsen or if the condition fails to improve as anticipated.  2. Rhinosinusitis Improving slowly after 2 courses of abx and prednisone X 1 mo Due to allergy vs virus? Was seen at [facility] a mo ago Vitals normal including sats Symptomatic treatment advised:  Increase fluids.  Rest.  Saline nasal spray/rinse.  Humidifier in bedroom. Flonase OTC - albuterol (VENTOLIN HFA) 108 (90 Base) MCG/ACT inhaler; INHALE 2 PUFFS INTO THE LUNGS THREE TIMES DAILY AS NEEDED FOR WHEEZING OR SHORTNESS OF BREATH  Dispense: 6.7 g; Refill: 0 Will reassess if symptoms worsen    FU PRN The patient was advised to call back or seek an in-person evaluation if the symptoms worsen or if the condition fails to improve as anticipated.  I discussed the assessment and treatment plan with the patient. The patient was provided an opportunity to ask questions and all were answered. The patient agreed with the plan and demonstrated an understanding of the instructions.  The entirety of the information documented in the History of Present Illness, Review of Systems and Physical Exam were personally obtained by me. Portions of this information were initially documented by the CMA and reviewed by me for thoroughness and accuracy.  Mardene Speak, [facility], [place] [phone] (phone) [phone] (fax)

## 2022-09-29 ENCOUNTER — Encounter: Payer: Self-pay | Admitting: Physician Assistant

## 2023-01-03 ENCOUNTER — Ambulatory Visit: Payer: BC Managed Care – PPO | Admitting: Family Medicine

## 2023-01-03 ENCOUNTER — Encounter: Payer: Self-pay | Admitting: Family Medicine

## 2023-01-03 VITALS — BP 130/84 | HR 71 | Temp 98.1°F | Resp 15 | Ht 67.0 in | Wt 159.2 lb

## 2023-01-03 DIAGNOSIS — H00011 Hordeolum externum right upper eyelid: Secondary | ICD-10-CM

## 2023-01-03 MED ORDER — ERYTHROMYCIN 5 MG/GM OP OINT: 1.0000 | TOPICAL_OINTMENT | Freq: Four times a day (QID) | OPHTHALMIC | 0 refills | Status: AC

## 2023-01-03 NOTE — Progress Notes (Signed)
I,Vanessa  Vital,acting as a Neurosurgeon for [employer], DO.,have documented all relevant documentation on the behalf of [employer], DO,as directed by  [employer], DO while in the presence of Sherlyn Hay, DO.    Established patient visit   Patient: Steven Sanchez   DOB: 02-10-1986   36 y.o. Male  MRN: [id number] Visit Date: 01/03/2023  Today's healthcare provider: Sherlyn Hay, DO   Chief Complaint  Patient presents with   Stye   Subjective    HPI HPI     Stye   Duration of 3 days.  Symptoms include eyelid pain, eyelid redness, eyelid swelling and bump on lid.  Response to treatment was no improvement.        Comments   Has Tried OTC treatments but has not helped.      Last edited by Lubertha Basque, CMA on 01/03/2023 10:43 AM.      Had one in both eyes ten years ago; didn't see a doctor and it took over month to heal. He was hoping coming in early this time would help it go away faster.  He has applied warm compresses four times over the last day (3-5 minutes each) and OTC Systane lubricant eyedrop for redness, burning and itching.  He has not noticed any improvement.  Medications: Outpatient Medications Prior to Visit  Medication Sig   albuterol (VENTOLIN HFA) 108 (90 Base) MCG/ACT inhaler Inhale 2 puffs into the lungs every 6 (six) hours as needed for wheezing or shortness of breath.   pantoprazole (PROTONIX) 40 MG tablet Take 1 tablet (40 mg total) by mouth daily.   valACYclovir (VALTREX) 500 MG tablet Take 1 tablet (500 mg total) by mouth 2 (two) times daily.   No facility-administered medications prior to visit.    Review of Systems  Eyes:  Positive for visual disturbance (intermittent, infrequent, mild blurriness).  All other systems reviewed and are negative.      Objective    BP 130/84 (BP Location: Right Arm, Patient Position: Sitting, Cuff Size: Normal)   Pulse 71   Temp 98.1 F (36.7 C) (Oral)   Resp 15   Ht 5\' 7"  (1.702 m)    Wt 159 lb 3.2 oz (72.2 kg)   SpO2 99%   BMI 24.93 kg/m    Physical Exam Constitutional:      General: He is not in acute distress.    Appearance: Normal appearance. He is not diaphoretic.  HENT:     Head: Normocephalic.  Eyes:     Conjunctiva/sclera: Conjunctivae normal.  Pulmonary:     Effort: Pulmonary effort is normal. No respiratory distress.  Skin:    Comments: Small swollen, erythematous area on lateral right upper eyelid  Neurological:     Mental Status: He is alert and oriented to person, place, and time. Mental status is at baseline.      No results found for any visits on 01/03/23.  Assessment & Plan     1. Hordeolum externum of right upper eyelid Will send erythromycin as noted.  - erythromycin ophthalmic ointment; Place 1 Application into the right eye 4 (four) times daily for 7 days. Apply 1 cm at location of stye as directed  Dispense: 3.5 g; Refill: 0  I discussed the assessment and treatment plan with the patient  The patient was provided an opportunity to ask questions and all were answered. The patient agreed with the plan and demonstrated an understanding of the instructions.  The patient was advised to call back or seek an in-person evaluation if the symptoms worsen or if the condition fails to improve as anticipated.   No follow-ups on file.      The entirety of the information documented in the History of Present Illness, Review of Systems and Physical Exam were personally obtained by me. Portions of this information were initially documented by the CMA, Erie Noe Vital, and reviewed by me for thoroughness and accuracy.     Sherlyn Hay, DO  [facility] [phone] (phone) [phone] (fax)  [facility]

## 2023-01-03 NOTE — Patient Instructions (Addendum)
Be sure to use a warm compress for 10-15 minutes prior to applying the antibiotic. Be sure to complete the full course of antibiotic, even if you improve prior to completion.  Try to avoid touching your eyes and be sure to wash your hands before AND after if you do need to touch your eyes. Hand sanitizer is an acceptable option for this, provided it is allowed to dry completely.  Contact us/schedule a follow-up appointment if you do not seem to be improving.

## 2023-01-18 NOTE — Progress Notes (Deleted)
Complete physical exam   Patient: Tyran Cordes   DOB: [birth year]   37 y.o. Male  MRN: [id number] Visit Date: 01/19/2023  Today's healthcare provider: Debera Lat, PA-C   No chief complaint on file.  Subjective    Tyronne Doose is a 37 y.o. male who presents today for a complete physical exam.  He reports consuming a {diet types:17450} diet. {Exercise:19826} He generally feels {well/fairly well/poorly:18703}. He reports sleeping {well/fairly well/poorly:18703}. He {does/does not:200015} have additional problems to discuss today.  HPI  ***  No past medical history on file. Past Surgical History:  Procedure Laterality Date   ESOPHAGOGASTRODUODENOSCOPY (EGD) WITH PROPOFOL N/A 08/03/2020   Procedure: ESOPHAGOGASTRODUODENOSCOPY (EGD) WITH PROPOFOL;  Surgeon: Midge Minium, MD;  Location: [facility] ENDOSCOPY;  Service: Endoscopy;  Laterality: N/A;   Social History   Socioeconomic History   Marital status: Married    Spouse name: Not on file   Number of children: Not on file   Years of education: Not on file   Highest education level: Not on file  Occupational History   Occupation: Tennis pro  Tobacco Use   Smoking status: Former    Types: Cigarettes    Quit date: 12/11/2019    Years since quitting: 3.1   Smokeless tobacco: Never  Vaping Use   Vaping Use: Never used  Substance and Sexual Activity   Alcohol use: Yes    Alcohol/week: 5.0 standard drinks of alcohol    Types: 5 Cans of beer per week    Comment: occasional   Drug use: No   Sexual activity: Yes  Other Topics Concern   Not on file  Social History Narrative   Lives with wife and one step-daughter   Social Determinants of Health   Financial Resource Strain: Not on file  Food Insecurity: Not on file  Transportation Needs: Not on file  Physical Activity: Not on file  Stress: Not on file  Social Connections: Not on file  Intimate Partner Violence: Not on file   Family Status  Relation  Name Status   Mother  Alive   Father  Alive   Brother  Alive   MGM  Deceased   MGF  Deceased   PGM  Deceased   PGF  Deceased   Family History  Problem Relation Age of Onset   Hypertension Father    No Known Allergies  Patient Care Team: [employer], Edmon Crape, PA-C as PCP - General (Physician Assistant) [place] Ouch, MD as PCP - Cardiology (Cardiology)   Medications: Outpatient Medications Prior to Visit  Medication Sig   albuterol (VENTOLIN HFA) 108 (90 Base) MCG/ACT inhaler Inhale 2 puffs into the lungs every 6 (six) hours as needed for wheezing or shortness of breath.   pantoprazole (PROTONIX) 40 MG tablet Take 1 tablet (40 mg total) by mouth daily.   valACYclovir (VALTREX) 500 MG tablet Take 1 tablet (500 mg total) by mouth 2 (two) times daily.   No facility-administered medications prior to visit.    Review of Systems  {Labs  Heme  Chem  Endocrine  Serology  Results Review (optional):23779}  Objective    There were no vitals taken for this visit. {Show previous vital signs (optional):23777}   Physical Exam  ***  Last depression screening scores    01/03/2023   10:56 AM 09/28/2022    8:20 AM 01/13/2022   10:03 AM  PHQ 2/9 Scores  PHQ - 2 Score 0 0 0  PHQ- 9 Score 0 0  0   Last fall risk screening    01/03/2023   10:56 AM  Fall Risk   Falls in the past year? 0  Number falls in past yr: 0  Injury with Fall? 0  Risk for fall due to : No Fall Risks  Follow up Falls evaluation completed   Last Audit-C alcohol use screening    01/03/2023   10:56 AM  Alcohol Use Disorder Test (AUDIT)  1. How often do you have a drink containing alcohol? 2  2. How many drinks containing alcohol do you have on a typical day when you are drinking? 1  3. How often do you have six or more drinks on one occasion? 0  AUDIT-C Score 3   A score of 3 or more in women, and 4 or more in men indicates increased risk for alcohol abuse, EXCEPT if all of the points are from question 1    No results found for any visits on 01/19/23.  Assessment & Plan    Routine Health Maintenance and Physical Exam  Exercise Activities and Dietary recommendations  Goals   None      There is no immunization history on file for this patient.  Health Maintenance  Topic Date Due   COVID-19 Vaccine (1) Never done   DTaP/Tdap/Td (1 - Tdap) Never done   INFLUENZA VACCINE  03/22/2023   Hepatitis C Screening  Completed   HIV Screening  Completed   HPV VACCINES  Aged Out    Discussed health benefits of physical activity, and encouraged him to engage in regular exercise appropriate for his age and condition.  ***  No follow-ups on file.     {provider attestation***:1}   Debera Lat, PA-C  [facility] [phone] (phone) [phone] (fax)  [facility]

## 2023-01-19 ENCOUNTER — Encounter: Payer: 59 | Admitting: Physician Assistant

## 2023-07-13 ENCOUNTER — Ambulatory Visit: Payer: BC Managed Care – PPO | Admitting: Family Medicine

## 2023-07-13 ENCOUNTER — Encounter: Payer: Self-pay | Admitting: Family Medicine

## 2023-07-13 VITALS — BP 118/78 | Temp 98.4°F | Ht 67.0 in | Wt 160.0 lb

## 2023-07-13 DIAGNOSIS — R0981 Nasal congestion: Secondary | ICD-10-CM

## 2023-07-13 DIAGNOSIS — B9689 Other specified bacterial agents as the cause of diseases classified elsewhere: Secondary | ICD-10-CM

## 2023-07-13 DIAGNOSIS — R0989 Other specified symptoms and signs involving the circulatory and respiratory systems: Secondary | ICD-10-CM | POA: Insufficient documentation

## 2023-07-13 DIAGNOSIS — J208 Acute bronchitis due to other specified organisms: Secondary | ICD-10-CM | POA: Diagnosis not present

## 2023-07-13 DIAGNOSIS — R062 Wheezing: Secondary | ICD-10-CM | POA: Diagnosis not present

## 2023-07-13 LAB — POCT INFLUENZA A/B
Influenza A, POC: NEGATIVE
Influenza B, POC: NEGATIVE

## 2023-07-13 LAB — POC COVID19 BINAXNOW: SARS Coronavirus 2 Ag: NEGATIVE

## 2023-07-13 MED ORDER — PREDNISONE 50 MG PO TABS: 50.0000 mg | ORAL_TABLET | Freq: Every day | ORAL | 0 refills | Status: AC

## 2023-07-13 MED ORDER — AMOXICILLIN-POT CLAVULANATE 875-125 MG PO TABS: 1.0000 | ORAL_TABLET | Freq: Two times a day (BID) | ORAL | 0 refills | Status: AC

## 2023-07-13 NOTE — Assessment & Plan Note (Signed)
5 days of URI symptoms; negative POC flu a/b, and COVID

## 2023-07-13 NOTE — Assessment & Plan Note (Signed)
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

## 2023-07-13 NOTE — Progress Notes (Signed)
Established patient visit   Patient: Steven Sanchez   DOB: February 16, 1986   37 y.o. Male  MRN: [id number] Visit Date: 07/13/2023  Today's healthcare provider: Jacky Kindle, FNP  Introduced to nurse practitioner role and practice setting.  All questions answered.  Discussed provider/patient relationship and expectations.  Subjective    HPI HPI   Congestion, sinus drip, headache, body ache--5 days Last edited by Shelly Bombard, CMA on 07/13/2023  9:58 AM.      Medications: Outpatient Medications Prior to Visit  Medication Sig   albuterol (VENTOLIN HFA) 108 (90 Base) MCG/ACT inhaler Inhale 2 puffs into the lungs every 6 (six) hours as needed for wheezing or shortness of breath.   pantoprazole (PROTONIX) 40 MG tablet Take 1 tablet (40 mg total) by mouth daily.   valACYclovir (VALTREX) 500 MG tablet Take 1 tablet (500 mg total) by mouth 2 (two) times daily.   No facility-administered medications prior to visit.   Last CBC Lab Results  Component Value Date   WBC 4.8 01/13/2022   HGB 14.5 01/13/2022   HCT 42.2 01/13/2022   MCV 88 01/13/2022   MCH 30.1 01/13/2022   RDW 12.0 01/13/2022   PLT 211 01/13/2022   Last metabolic panel Lab Results  Component Value Date   GLUCOSE 78 01/13/2022   NA 141 01/13/2022   K 4.0 01/13/2022   CL 103 01/13/2022   CO2 23 01/13/2022   BUN 13 01/13/2022   CREATININE 1.05 01/13/2022   EGFR 96 01/13/2022   CALCIUM 9.5 01/13/2022   PROT 6.8 01/13/2022   ALBUMIN 4.7 01/13/2022   LABGLOB 2.1 01/13/2022   AGRATIO 2.2 01/13/2022   BILITOT 0.6 01/13/2022   ALKPHOS 60 01/13/2022   AST 20 01/13/2022   ALT 14 01/13/2022   ANIONGAP 11 12/19/2019     Objective    BP 118/78   Temp 98.4 F (36.9 C)   Ht 5\' 7"  (1.702 m)   Wt 160 lb (72.6 kg)   SpO2 96%   BMI 25.06 kg/m   BP Readings from Last 3 Encounters:  07/13/23 118/78  01/03/23 130/84  09/28/22 129/89   Wt Readings from Last 3 Encounters:  07/13/23 160 lb (72.6 kg)   01/03/23 159 lb 3.2 oz (72.2 kg)  09/28/22 162 lb (73.5 kg)   SpO2 Readings from Last 3 Encounters:  07/13/23 96%  01/03/23 99%  09/28/22 100%   Physical Exam Vitals and nursing note reviewed.  Constitutional:      Appearance: Normal appearance. He is normal weight.  HENT:     Head: Normocephalic and atraumatic.     Right Ear: Tympanic membrane, ear canal and external ear normal.     Left Ear: Tympanic membrane, ear canal and external ear normal.     Nose: Congestion present.     Mouth/Throat:     Mouth: Mucous membranes are moist.     Pharynx: Oropharynx is clear.  Eyes:     Conjunctiva/sclera: Conjunctivae normal.  Cardiovascular:     Rate and Rhythm: Normal rate and regular rhythm.     Pulses: Normal pulses.     Heart sounds: Normal heart sounds.  Pulmonary:     Effort: Pulmonary effort is normal.     Breath sounds: Wheezing present.     Comments: With deep expirations  Chest:     Chest wall: Tenderness present.  Musculoskeletal:        General: Normal range of motion.  Cervical back: Normal range of motion.  Skin:    General: Skin is warm and dry.     Capillary Refill: Capillary refill takes less than 2 seconds.  Neurological:     General: No focal deficit present.     Mental Status: He is alert and oriented to person, place, and time. Mental status is at baseline.     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     Leilani Merl, FNP, have reviewed all documentation for this visit. The documentation on 07/13/23 for the exam, diagnosis, procedures, and orders are all accurate and complete.  Jacky Kindle, FNP  [facility] [phone] (phone) [phone] (fax)  [facility]
